# Patient Record
Sex: Female | Born: 1935 | Race: White | Hispanic: No | Marital: Married | Smoking: Never smoker
Health system: Southern US, Community
[De-identification: ages and names within clinical notes are randomized; demographics above are authoritative.]

## PROBLEM LIST (undated history)

## (undated) DIAGNOSIS — I471 Supraventricular tachycardia, unspecified: Secondary | ICD-10-CM

## (undated) DIAGNOSIS — I4719 Other supraventricular tachycardia: Secondary | ICD-10-CM

## (undated) DIAGNOSIS — K219 Gastro-esophageal reflux disease without esophagitis: Secondary | ICD-10-CM

## (undated) DIAGNOSIS — E785 Hyperlipidemia, unspecified: Secondary | ICD-10-CM

## (undated) HISTORY — PX: BREAST SURGERY: SHX581

## (undated) HISTORY — PX: CATARACT EXTRACTION: SUR2

## (undated) HISTORY — DX: Supraventricular tachycardia, unspecified: I47.10

## (undated) HISTORY — PX: ABDOMINAL HYSTERECTOMY: SHX81

## (undated) HISTORY — PX: CHOLECYSTECTOMY: SHX55

## (undated) HISTORY — PX: CARDIOVERSION: SHX1299

## (undated) HISTORY — PX: CARDIAC ELECTROPHYSIOLOGY MAPPING AND ABLATION: SHX1292

## (undated) HISTORY — DX: Hyperlipidemia, unspecified: E78.5

## (undated) HISTORY — DX: Other supraventricular tachycardia: I47.19

## (undated) HISTORY — PX: APPENDECTOMY: SHX54

## (undated) HISTORY — DX: Supraventricular tachycardia: I47.1

## (undated) HISTORY — PX: TONSILLECTOMY: SUR1361

---

## 1997-08-14 ENCOUNTER — Ambulatory Visit (HOSPITAL_COMMUNITY): Admission: RE | Admit: 1997-08-14 | Discharge: 1997-08-14 | Payer: Self-pay | Admitting: Family Medicine

## 1998-08-18 ENCOUNTER — Ambulatory Visit (HOSPITAL_COMMUNITY): Admission: RE | Admit: 1998-08-18 | Discharge: 1998-08-18 | Payer: Self-pay

## 1999-08-20 ENCOUNTER — Encounter: Payer: Self-pay | Admitting: Family Medicine

## 1999-08-20 ENCOUNTER — Ambulatory Visit (HOSPITAL_COMMUNITY): Admission: RE | Admit: 1999-08-20 | Discharge: 1999-08-20 | Payer: Self-pay | Admitting: Family Medicine

## 2000-08-23 ENCOUNTER — Encounter: Payer: Self-pay | Admitting: Family Medicine

## 2000-08-23 ENCOUNTER — Ambulatory Visit (HOSPITAL_COMMUNITY): Admission: RE | Admit: 2000-08-23 | Discharge: 2000-08-23 | Payer: Self-pay | Admitting: Family Medicine

## 2000-09-22 ENCOUNTER — Other Ambulatory Visit: Admission: RE | Admit: 2000-09-22 | Discharge: 2000-09-22 | Payer: Self-pay | Admitting: Family Medicine

## 2001-08-24 ENCOUNTER — Ambulatory Visit (HOSPITAL_COMMUNITY): Admission: RE | Admit: 2001-08-24 | Discharge: 2001-08-24 | Payer: Self-pay | Admitting: Family Medicine

## 2001-08-24 ENCOUNTER — Encounter: Payer: Self-pay | Admitting: Family Medicine

## 2002-07-27 ENCOUNTER — Emergency Department (HOSPITAL_COMMUNITY): Admission: EM | Admit: 2002-07-27 | Discharge: 2002-07-27 | Payer: Self-pay | Admitting: Emergency Medicine

## 2002-08-29 ENCOUNTER — Encounter: Payer: Self-pay | Admitting: Family Medicine

## 2002-08-29 ENCOUNTER — Ambulatory Visit (HOSPITAL_COMMUNITY): Admission: RE | Admit: 2002-08-29 | Discharge: 2002-08-29 | Payer: Self-pay | Admitting: Family Medicine

## 2002-11-08 ENCOUNTER — Other Ambulatory Visit: Admission: RE | Admit: 2002-11-08 | Discharge: 2002-11-08 | Payer: Self-pay | Admitting: Family Medicine

## 2002-11-19 ENCOUNTER — Emergency Department (HOSPITAL_COMMUNITY): Admission: EM | Admit: 2002-11-19 | Discharge: 2002-11-19 | Payer: Self-pay | Admitting: Emergency Medicine

## 2002-11-22 ENCOUNTER — Ambulatory Visit (HOSPITAL_COMMUNITY): Admission: RE | Admit: 2002-11-22 | Discharge: 2002-11-23 | Payer: Self-pay | Admitting: Internal Medicine

## 2003-07-08 ENCOUNTER — Ambulatory Visit (HOSPITAL_COMMUNITY): Admission: RE | Admit: 2003-07-08 | Discharge: 2003-07-08 | Payer: Self-pay | Admitting: Family Medicine

## 2003-09-06 ENCOUNTER — Encounter: Admission: RE | Admit: 2003-09-06 | Discharge: 2003-09-06 | Payer: Self-pay | Admitting: Family Medicine

## 2004-09-17 ENCOUNTER — Encounter: Admission: RE | Admit: 2004-09-17 | Discharge: 2004-09-17 | Payer: Self-pay | Admitting: Family Medicine

## 2004-11-12 ENCOUNTER — Other Ambulatory Visit: Admission: RE | Admit: 2004-11-12 | Discharge: 2004-11-12 | Payer: Self-pay | Admitting: Family Medicine

## 2004-11-17 ENCOUNTER — Ambulatory Visit (HOSPITAL_COMMUNITY): Admission: RE | Admit: 2004-11-17 | Discharge: 2004-11-17 | Payer: Self-pay | Admitting: Family Medicine

## 2004-11-19 ENCOUNTER — Ambulatory Visit (HOSPITAL_COMMUNITY): Admission: RE | Admit: 2004-11-19 | Discharge: 2004-11-19 | Payer: Self-pay | Admitting: Family Medicine

## 2004-11-23 ENCOUNTER — Ambulatory Visit: Admission: RE | Admit: 2004-11-23 | Discharge: 2004-11-23 | Payer: Self-pay | Admitting: Family Medicine

## 2005-08-11 ENCOUNTER — Ambulatory Visit: Payer: Self-pay | Admitting: Cardiology

## 2005-09-20 ENCOUNTER — Encounter: Admission: RE | Admit: 2005-09-20 | Discharge: 2005-09-20 | Payer: Self-pay | Admitting: Family Medicine

## 2005-09-30 ENCOUNTER — Encounter: Admission: RE | Admit: 2005-09-30 | Discharge: 2005-09-30 | Payer: Self-pay | Admitting: Family Medicine

## 2006-09-23 ENCOUNTER — Encounter: Admission: RE | Admit: 2006-09-23 | Discharge: 2006-09-23 | Payer: Self-pay | Admitting: Family Medicine

## 2007-02-07 ENCOUNTER — Ambulatory Visit (HOSPITAL_COMMUNITY): Admission: RE | Admit: 2007-02-07 | Discharge: 2007-02-08 | Payer: Self-pay | Admitting: Urology

## 2007-09-25 ENCOUNTER — Encounter: Admission: RE | Admit: 2007-09-25 | Discharge: 2007-09-25 | Payer: Self-pay | Admitting: Family Medicine

## 2008-03-07 ENCOUNTER — Ambulatory Visit: Payer: Self-pay | Admitting: Cardiology

## 2008-03-07 DIAGNOSIS — R002 Palpitations: Secondary | ICD-10-CM

## 2008-03-07 DIAGNOSIS — I471 Supraventricular tachycardia, unspecified: Secondary | ICD-10-CM | POA: Insufficient documentation

## 2008-03-07 DIAGNOSIS — E785 Hyperlipidemia, unspecified: Secondary | ICD-10-CM

## 2008-04-01 ENCOUNTER — Ambulatory Visit: Payer: Self-pay | Admitting: Cardiology

## 2008-06-03 ENCOUNTER — Ambulatory Visit: Payer: Self-pay | Admitting: Cardiology

## 2008-06-03 ENCOUNTER — Encounter: Payer: Self-pay | Admitting: Cardiology

## 2008-09-25 ENCOUNTER — Encounter: Admission: RE | Admit: 2008-09-25 | Discharge: 2008-09-25 | Payer: Self-pay | Admitting: Family Medicine

## 2009-04-04 ENCOUNTER — Encounter (INDEPENDENT_AMBULATORY_CARE_PROVIDER_SITE_OTHER): Payer: Self-pay | Admitting: *Deleted

## 2009-07-25 ENCOUNTER — Ambulatory Visit: Payer: Self-pay | Admitting: Cardiology

## 2010-01-02 ENCOUNTER — Encounter: Admission: RE | Admit: 2010-01-02 | Discharge: 2010-01-02 | Payer: Self-pay | Admitting: Family Medicine

## 2010-04-19 ENCOUNTER — Encounter: Payer: Self-pay | Admitting: Family Medicine

## 2010-04-30 NOTE — Letter (Signed)
Summary: Appointment - Reminder 2  [facility], Main Office  [address]   [place], [place] [address]   Phone: [phone]  Fax: [phone]     April 04, 2009 MRN: [id number]   [name] [address] [place], [place]  [address]   Dear [name],  Our records indicate that it is time to schedule a follow-up appointment. Dr.Hochrein recommended that you follow up with [place] in March,2011. It is very important that we reach you to schedule this appointment. We look forward to participating in your health care needs. Please contact us at the number listed above at your earliest convenience to schedule your appointment.  If you are unable to make an appointment at this time, give [place] a call so we can update our records.     Sincerely,   Glass blower/designer

## 2010-04-30 NOTE — Assessment & Plan Note (Signed)
Summary: f1y/ gd  Medications Added PROTONIX 40 MG TBEC (PANTOPRAZOLE SODIUM) 1 by mouth daily ASPIRIN 81 MG  TABS (ASPIRIN) 1 by mouth daily METOPROLOL TARTRATE 25 MG TABS (METOPROLOL TARTRATE) 1/2 by mouth daily FLONASE 50 MCG/ACT SUSP (FLUTICASONE PROPIONATE) as directed VITAMIN B-12 1000 MCG TABS (CYANOCOBALAMIN) 1 by mouth daily      Allergies Added:    Visit Type:  Follow-up Primary Provider:  Dr. Merri Brunette  CC:  palpitations.  History of Present Illness: The patient presents for followup of palpitations. Over the past year she has had no significant palpitations until 9 days ago when she had an epidural injection. Since that time she's had lots of problems associated with the injection including fevers and chills aches and pain. This is slowly resolving. During that time she had increased palpitations in fact one day took an extra half of metoprolol. These are sporadic. They're not associated with presyncope or syncope. She's had no syncopal episodes. She's somewhat limited by back pain but denies any chest pressure, neck or arm discomfort. She's had no shortness of breath, PND or orthopnea  Current Medications (verified): 1)  Allegra 180 Mg Tabs (Fexofenadine Hcl) .... One By Mouth Daily 2)  Estrodiol 0.25mg  .... One By Mouth Daily 3)  Crestor 5 Mg Tabs (Rosuvastatin Calcium) .... One By Mouth Daily 4)  Protonix 40 Mg Tbec (Pantoprazole Sodium) .Marland Kitchen.. 1 By Mouth Daily 5)  Timolol Gtts .... As Directed 6)  Xalatan Gtts .... As Directed 7)  Systane Gtts .... As Directed 8)  Optivar Gtts .... As Directed 9)  Aspirin 81 Mg  Tabs (Aspirin) .Marland Kitchen.. 1 By Mouth Daily 10)  Calcium .... One By Mouth Daily 11)  Fish Oil .... One By Mouth Daily 12)  Multivitamin .... One By Mouth Daily 13)  Clorazepate Dipotassium 3.75 Mg Tabs (Clorazepate Dipotassium) .... One By Mouth Daily 14)  Metoprolol Tartrate 25 Mg Tabs (Metoprolol Tartrate) .... 1/2 By Mouth Daily 15)  Flonase 50 Mcg/act Susp  (Fluticasone Propionate) .... As Directed 16)  Vitamin B-12 1000 Mcg Tabs (Cyanocobalamin) .Marland Kitchen.. 1 By Mouth Daily  Allergies (verified): 1)  ! Codeine 2)  ! Flagyl 3)  ! Effexor 4)  ! * Pseudoephedrine 5)  ! Zoloft  Past History:  Past Medical History: HYPERLIPIDEMIA-MIXED (ICD-272.4) SVT/ PSVT/ PAT (ICD-427.0)  Review of Systems       As stated in the HPI and negative for all other systems.   Vital Signs:  Patient profile:   77 year old female Height:      63 inches Weight:      153 pounds BMI:     27.20 Pulse rate:   61 / minute Resp:     16 per minute BP sitting:   122 / 76  (right arm)  Vitals Entered By: Marrion Coy, CNA (July 25, 2009 11:26 AM)  Physical Exam  General:  Well developed, well nourished, in no acute distress. Head:  normocephalic and atraumatic Eyes:  PERRLA/EOM intact; conjunctiva and lids normal. Mouth:  Teeth, gums and palate normal. Oral mucosa normal. Neck:  Neck supple, no JVD. No masses, thyromegaly or abnormal cervical nodes. Chest Wall:  no deformities or breast masses noted Lungs:  Clear bilaterally to auscultation and percussion. Abdomen:  Bowel sounds positive; abdomen soft and non-tender without masses, organomegaly, or hernias noted. No hepatosplenomegaly. Msk:  Back normal, normal gait. Muscle strength and tone normal. Extremities:  No clubbing or cyanosis. Neurologic:  Alert and oriented x 3. Skin:  Intact without lesions or rashes. Cervical Nodes:  no significant adenopathy Axillary Nodes:  no significant adenopathy Inguinal Nodes:  no significant adenopathy Psych:  Normal affect.   Detailed Cardiovascular Exam  Neck    Carotids: Carotids full and equal bilaterally without bruits.      Neck Veins: Normal, no JVD.    Heart    Inspection: no deformities or lifts noted.      Palpation: normal PMI with no thrills palpable.      Auscultation: regular rate and rhythm, S1, S2 without murmurs, rubs, gallops, or clicks.     Vascular    Abdominal Aorta: no palpable masses, pulsations, or audible bruits.      Femoral Pulses: normal femoral pulses bilaterally.      Pedal Pulses: normal pedal pulses bilaterally.      Radial Pulses: normal radial pulses bilaterally.      Peripheral Circulation: no clubbing, cyanosis, or edema noted with normal capillary refill.     EKG  Procedure date:  07/25/2009  Findings:      sinus rhythm, rate 61, axis within normal limits, intervals within normal limits, no acute ST-T wave changes  Impression & Recommendations:  Problem # 1:  PALPITATIONS (ICD-785.1) These are only mildly symptomatic. At this point I would suggest no change in therapy. She can take extra metoprolol p.r.n. Orders: EKG w/ Interpretation (93000)  Her updated medication list for this problem includes:    Aspirin 81 Mg Tabs (Aspirin) .Marland Kitchen... 1 by mouth daily    Metoprolol Tartrate 25 Mg Tabs (Metoprolol tartrate) .Marland Kitchen... 1/2 by mouth daily  Problem # 2:  SVT/ PSVT/ PAT (ICD-427.0) She has had no symptomatic recurrence of this arrhythmia. Orders: EKG w/ Interpretation (93000)  Patient Instructions: 1)  Your physician recommends that you schedule a follow-up appointment AS NEEDED 2)  Your physician recommends that you continue on your current medications as directed. Please refer to the Current Medication list given to you today.

## 2010-07-18 ENCOUNTER — Encounter: Payer: Self-pay | Admitting: Cardiology

## 2010-07-21 ENCOUNTER — Ambulatory Visit (INDEPENDENT_AMBULATORY_CARE_PROVIDER_SITE_OTHER): Payer: Medicare Other | Admitting: Cardiology

## 2010-07-21 ENCOUNTER — Encounter: Payer: Self-pay | Admitting: Cardiology

## 2010-07-21 VITALS — BP 135/59 | HR 61 | Ht 63.5 in | Wt 142.1 lb

## 2010-07-21 DIAGNOSIS — I471 Supraventricular tachycardia: Secondary | ICD-10-CM

## 2010-07-21 DIAGNOSIS — R002 Palpitations: Secondary | ICD-10-CM

## 2010-07-21 DIAGNOSIS — E785 Hyperlipidemia, unspecified: Secondary | ICD-10-CM

## 2010-07-21 NOTE — Patient Instructions (Signed)
You are being scheduled for a stress test to be done with Dr Antoine Poche.  Please follow instructions on the sheet given you. Continue current medications

## 2010-07-21 NOTE — Assessment & Plan Note (Signed)
She had is ablated in 2004. No change in therapy is indicated.

## 2010-07-21 NOTE — Progress Notes (Signed)
HPI The patient presents for followup supraventricular tachycardia and palpitations. Since I last saw her she has had no further cardiovascular symptoms other than occasional palpitations. She starting to do a little exercising along with her husband who recently had bypass. She denies any chest pressure, neck or arm discomfort. She did not have any presyncope or syncope. She is having no new shortness of breath, PND or orthopnea. She has had no weight gain or edema.  Allergies  Allergen Reactions  . Codeine   . Metronidazole   . Pseudoephedrine   . Sertraline Hcl   . Venlafaxine     Current Outpatient Prescriptions  Medication Sig Dispense Refill  . aspirin 81 MG tablet Take 81 mg by mouth daily.        Marland Kitchen azelastine (OPTIVAR) 0.05 % ophthalmic solution 1 drop. As directed       . clorazepate (TRANXENE) 3.75 MG tablet Take 3.75 mg by mouth daily.        . fexofenadine (ALLEGRA) 180 MG tablet Take 180 mg by mouth daily.        . fluticasone (FLONASE) 50 MCG/ACT nasal spray 2 sprays by Nasal route as directed.        . latanoprost (XALATAN) 0.005 % ophthalmic solution 1 drop as directed.        . metoprolol tartrate (LOPRESSOR) 25 MG tablet Take 1/2 tablet daily       . Multiple Vitamin (MULTIVITAMIN) tablet Take 1 tablet by mouth daily.        . Multiple Vitamins-Calcium (VIACTIV MULTI-VITAMIN) CHEW Chew 1 each by mouth 2 (two) times daily.        . Omega-3 Fatty Acids (FISH OIL) 1000 MG CAPS Take 1 capsule by mouth daily.        . pantoprazole (PROTONIX) 40 MG tablet Take 40 mg by mouth daily.        Bertram Gala Glycol-Propyl Glycol (SYSTANE) 0.4-0.3 % SOLN Apply to eye. As directed       . rosuvastatin (CRESTOR) 5 MG tablet Take 5 mg by mouth daily.        . timolol (BETIMOL) 0.5 % ophthalmic solution 1 drop as directed.        Marland Kitchen DISCONTD: calcium carbonate 200 MG capsule Take 250 mg by mouth daily.        Marland Kitchen DISCONTD: rosuvastatin (CRESTOR) 20 MG tablet Take 20 mg by mouth daily.          Marland Kitchen DISCONTD: vitamin B-12 (CYANOCOBALAMIN) 1000 MCG tablet Take 1,000 mcg by mouth daily.          Past Medical History  Diagnosis Date  . Hyperlipidemia     mixed  . SVT (supraventricular tachycardia)   . PSVT (paroxysmal supraventricular tachycardia)   . PAT (paroxysmal atrial tachycardia)     No past surgical history on file.  ROS: As stated in the HPI and negative for all other systems.  PHYSICAL EXAM BP 135/59  Pulse 61  Ht 5' 3.5" (1.613 m)  Wt 142 lb 1.9 oz (64.465 kg)  BMI 24.78 kg/m2 GENERAL:  Well appearing HEENT:  Pupils equal round and reactive, fundi not visualized, oral mucosa unremarkable NECK:  No jugular venous distention, waveform within normal limits, carotid upstroke brisk and symmetric, no bruits, no thyromegaly LYMPHATICS:  No cervical, inguinal adenopathy LUNGS:  Clear to auscultation bilaterally BACK:  No CVA tenderness CHEST:  Unremarkable HEART:  PMI not displaced or sustained,S1 and S2 within normal limits, no S3, no S4,  no clicks, no rubs, no murmurs ABD:  Flat, positive bowel sounds normal in frequency in pitch, no bruits, no rebound, no guarding, no midline pulsatile mass, no hepatomegaly, no splenomegaly EXT:  2 plus pulses throughout, no edema, no cyanosis no clubbing SKIN:  No rashes no nodules NEURO:  Cranial nerves II through XII grossly intact, motor grossly intact throughout PSYCH:  Cognitively intact, oriented to person place and time  EKG:  Sinus rhythm, premature atrial contractions, axis within normal limits, intervals within normal limits, no acute ST-T wave changes.  ASSESSMENT AND PLAN

## 2010-07-21 NOTE — Assessment & Plan Note (Signed)
This is followed closely by her primary provider and I will defer her management.

## 2010-07-21 NOTE — Assessment & Plan Note (Signed)
The patient has rare symptomatic palpitations. At this point no change in therapy is indicated. She would like to start more vigorous exercise regimen with her husband. I will bring her back for a plain old exercise treadmill test to rule out obstructive coronary disease, risk stratify and give her a prescription for exercise.

## 2010-08-11 NOTE — Assessment & Plan Note (Signed)
[facility]                            CARDIOLOGY OFFICE NOTE   NAME:[name], [name]                      MRN:          [id number]  DATE:03/07/2008                            DOB:          [birth year]    PRIMARY CARE PHYSICIAN:  Dario Guardian, M.D.   PRIMARY CARDIOLOGIST:  Rollene Rotunda, MD, [facility]   HISTORY OF PRESENT ILLNESS:  [name] is a pleasant 77 year old  Caucasian female with a known history of supraventricular tachycardia  status post previous ablation by Dr. Lewayne Bunting in 2004.  [name]  has done well since that time.  Currently not on any rhythm  controlling/rate controlling medications at this time.  She saw her  primary care physician recently for episodes of palpitations, not  associated with any lightheadedness, dizziness, presyncope, or syncopal  episodes.  The patient states this is not anything like the SVT, she had  back in 2004.  She states this feels like a flutter or flipping  sensation in her chest that radiates up into her neck, sometimes.  She  notices it more in the evenings when she is relaxing.  It is fleeting,  does not last for any extended period of time and, as stated, no  associated symptoms.  [name], otherwise is in good health.  She  remains very active taking care of her house,up and down 14 steps in her  house daily without any problems.  She saw Dr. Katrinka Blazing as stated recently.  A 12-lead EKG was obtained at Dr. Michaelle Copas office, that shows sinus  rhythm with occasional PVC and a rate of 74.  Blood work was also  obtained at that visit.  H&H of 13.6 and 41.2 respectively.  A  creatinine of 0.8 and potassium 5.3.  TSH of 1.80.  [name] denies  any increased frequency or duration in her palpitations.   REVIEW OF SYSTEMS:  As stated above.   PAST MEDICAL HISTORY:  1. SVT status post EP study/ablation.      a.     Three distinct supraventricular tachycardias, the first       sinus node reentry,  the second AV node reentry, and the third       atrial flutter.  2. Status post laparoscopic cholecystectomy.  3. Status post hysterectomy.  4. Mildly elevated total cholesterol with initiation of statin      therapy.  5. Breast biopsy.  6. Appendectomy.   PHYSICAL EXAMINATION:  VITAL SIGNS:  Weight is 153 pounds, blood  pressure 138/60 with a heart rate of 73.  Repeat blood pressure manually  138/78.  GENERAL:  [name] is in no acute distress.  NECK:  No signs of jugular vein distention at 45-degree angle.  LUNGS:  Clear to auscultation bilaterally.  CARDIOVASCULAR:  Reveals S1 and S2.  Regular rate and rhythm.  ABDOMEN:  Soft, nontender, and positive bowel sounds.  EXTREMITIES:  Lower extremities without clubbing, cyanosis or edema.  Pedal pulses are 2+.  NEUROLOGICAL:  Alert and oriented x3.   A 12-lead EKG showing sinus rhythm with lone PVC at a  rate of 73.  No  acute ST or T-wave changes.   IMPRESSION:  Palpitations:  A 12-lead EKG significant only for lone  premature ventricular contractions.  Recent lab work obtained by primary  care physician showed a normal TSH and potassium within normal range.  [name] is somewhat concerned about her palpitations.  For  reassurance, we will proceed with a 48-hour Holter monitor and have the  patient follow up with Dr. Antoine Poche for reevaluation.  [name] is  not a candidate for any type of beta-blocker at this time.  We will have  her follow up with Dr. Antoine Poche after her Holter monitor for  reevaluation.   CURRENT MEDICATIONS:  Only include:  1. Aspirin 81 mg.  2. Nexium.  3. Allegra.  4. Lorazepam.  5. Estradiol 1 mg.  6. The patient was previously on a beta-blocker, which has been      discontinued.      [name], ACNP  Electronically Signed      Rollene Rotunda, MD, [facility]  Electronically Signed   MB/MedQ  DD: 03/07/2008  DT: 03/08/2008  Job #: [id number]   cc:   Dario Guardian, M.D.

## 2010-08-11 NOTE — Op Note (Signed)
NAMELAKINA, MCINTIRE               ACCOUNT NO.:  [id number]   MEDICAL RECORD NO.:  [id number]          PATIENT TYPE:  AMB   LOCATION:  DAY                          FACILITY:  [facility]   PHYSICIAN:  Martina Sinner, MD DATE OF BIRTH:  1932-10-02   DATE OF PROCEDURE:  02/07/2007  DATE OF DISCHARGE:                               OPERATIVE REPORT   SURGEON:  Martina Sinner, MD.   ASSISTANT:  Dr. Rachel Bo, urology resident.   PREOPERATIVE DIAGNOSIS:  Stress urinary incontinence, mild cystocele,  mild rectocele.   POSTOPERATIVE DIAGNOSIS:  Stress urinary incontinence, mild cystocele,  mild rectocele.   SURGERY:  [facility]) plus cystoscopy plus rectocele  repair.   [name] has stress urinary incontinence.  She has mild prolapse.  Prior to surgery, I was not certain whether or not she had a cystocele  or rectocele that was symptomatic even in the standing position.  I have  dictated in regards to this extensively.   The patient was prepped and draped in the usual fashion.  Extra care was  taken positioning her legs to minimize the risk of compartment syndrome,  neuropathy and DVT.   Initially, two 1 cm incisions were made 1.5 cm lateral to the midline,  one fingerbreadth above the symphysis pubis.  I then made a 2 cm  incision overlying the mid urethra.  I instilled approximately 8 or 9 mL  of a lidocaine epinephrine mixture.  I made a thick anterior vaginal  wall incision underlying the mid urethra, making certain that there was  thick pubocervical fascia.  I dissected sharply to the urethrovesical  angle bilaterally.  I could palpate the urethrovesical angle  bilaterally.   With the bladder emptied, I passed a SPARC needle on top of and along  the back of the symphysis pubis parallel to the midline on the pulp of  my left index finger bilaterally.  I then cystoscoped the patient.  I  felt that the trocars were a little bit too close to the bladder  since  there were indenting the bladder when I would wiggle them.  They were  not submucosal but they were removed and replaced.   Used the same technique, I moved at least a centimeter lateral and  stayed parallel to the midline and delivered the tips onto the pulp of  my left index finger.  I then cystoscoped the patient and the right side  was excellent.  On the left side, there was mild indentation at 2  o'clock but it was minimal.  I really had stayed quite lateral on the  left and I felt it was unsafe to go more lateral with the risk of  bleeding, etc., and it would not be anatomic.  There was efflux of  indigo carmine from both ureteral orifices.  There was no injury to the  bladder or urethra.  Overall I was very pleased with the placement of  the trocars.   With the bladder empty, I attached a SPARC sling and brought it up  through the retropubic space.  I tensioned the  sling over the fat part  of a [employer].  I cut below the blue dots, irrigated the sheath  and removed the sheath.  I was very happy with the position of the  sling.  I was happy with the tension with hypermobility of the sling.  There was very little to no spring-back effect.   All incisions were irrigated.  I closed the anterior vaginal wall with  running 2-0 Vicryl on a CT-1 needle.  I did two interrupted sutures.  I  cut the sling below the skin and closed the skin with 4-0 Vicryl  subcuticular and Dermabond.   There was no question at the beginning of the case and throughout the  case, she had a little to no cystocele.  Her vaginal cuff was very well  supported.  I could not pull the anterior vaginal wall downward even  under anesthesia.  I felt there was no central defect that imbricate and  therefore I did not do a cystocele repair.   Under anesthesia, I double-gloved with my left hand and I felt that she  did have a mild distal rectocele.  She did have a narrow, moderately  long introitus  with no deficiency near the apex posteriorly or apically.   I placed an Allis clamp on the hymenal ring bilaterally and removed a  small triangle of perineal skin.  I then carefully opened up the  posterior vaginal wall in the midline to approximately 2 cm from the  vaginal cuff.  I used approximately 12 mL of a lidocaine epinephrine  mixture to help with hemostasis and with the plane.  She was narrow and  her tissues were a little bit thin and I was careful to use sharp  dissection, followed by a little bit of blunt dissection to take down  the rectum from the posterior vaginal wall to the lateral sidewall  bilaterally.  I did not break through the pararectal pillars  bilaterally.  There was little to no bleeding.   I then gloved again and she did have diffuse weakening of the  rectovaginal fascia but the rectovaginal fascia was reasonable.  She did  have an apical defect.   I did a posterior imbricating repair with 2-0 Vicryl on S8 suture.  I  then used 3-0 Vicryl to do a trapdoor closure of the apical defect with  my usual technique.  I then placed two interrupted 2-0 Vicryls to help  strengthen the one layer closure.  She really did not have a lot of  bulging to imbricate.   I trimmed approximately 2-3 mm of vaginal wall from both sides.  I  closed the posterior vaginal wall with running 2-0 Vicryl and a CT-1  needle.   At the end of the case she had good vaginal length, excellent reduction  of anterior and posterior vaginal wall with no ring or narrowing.  When  I closed the posterior vaginal wall, the suture was brought out through  the perineum and I closed the perineal incision with a subcuticular 2-0  Vicryl suture.   Irrigation was utilized.  Vaginal pack with Estrace cream was inserted.  Catheter was draining blue urine at the end of the case.  Leg position  was good at the end of the case.   Hopefully, Mrs. Cunanan will reach her treatment goal with the rectocele   repair and sling.           ______________________________  Martina Sinner, MD  Electronically  Signed     SAM/MEDQ  D:  02/07/2007  T:  02/07/2007  Job:  [id number]

## 2010-08-11 NOTE — Assessment & Plan Note (Signed)
[facility]                            CARDIOLOGY OFFICE NOTE   NAME:[name], [name]                      MRN:          [id number]  DATE:04/01/2008                            DOB:          16-May-1932    PRIMARY CARE PHYSICIAN:  Carol Guardian, MD   REASON FOR PRESENTATION:  Evaluate the patient with palpations.   HISTORY OF PRESENT ILLNESS:  The patient presents for followup after  seeing our physician extender in early December 2009.  She was having  palpitations as extensively described in that note.  She did wear a  Holter monitor.  This was a 24-hour monitor that demonstrated about  2,500 supraventricular beats out of a total of 111,000.  They were very  rare ventricular beats.  She did have 1 hour, over which she had about  300 beats.  This was at a party around 7 p.m., the night she wore the  monitor.  She did feel this.  She does not keep a diary.  She had no  presyncope or syncope.  She feels strong or skipped beats.  She has not  felt any of the rapid beats which she had prior to SVT ablation.  She is  not having any chest discomfort, neck or arm discomfort.  The patient  does not have any palpitation, presyncope, or syncope.  She is not  having any PND or orthopnea.  She remains active.   PAST MEDICAL HISTORY:  Supraventricular tachycardia, status post  ablation (there were 3 SVT tachycardias with sinus node re-entrant, AV  node re-entrant, and atrial flutter that were ablated), mildly elevated  dyslipidemia, borderline blood pressures in the past, laparoscopic  cholecystectomy, hysterectomy, breast biopsy, appendectomy.   ALLERGIES/INTOLERANCES:  CODEINE, FLAGYL, EFFEXOR, PSEUDOEPHEDRINE, and  ZOLOFT.   MEDICATIONS:  1. Allegra 180 mg daily.  2. Estradiol 0.25 mg daily.  3. Crestor 5 mg daily.  4. Timolol.  5. Xalatan.  6. Cysteine.  7. Optivar.  8. Aspirin 81 mg daily.  9. Calcium 600 mg daily.  10.Fish oil.  11.Multivitamin.  12.Clorazepate 3.75 mg twice daily.   REVIEW OF SYSTEMS:  As stated in the HPI and otherwise negative for  other systems.   PHYSICAL EXAMINATION:  GENERAL:  The patient is pleasant and in no  distress.  VITAL SIGNS:  Blood pressure 130/67, heart rate 70 and regular, weight  154 pounds, and body mass index 26.  HEENT:  Eyes are unremarkable.  Pupils equal, round, and reactive to  light.  Fundi not visualized.  Oral mucosa unremarkable.  NECK:  No jugular venous distention at 45 degrees.  Carotid upstroke  brisk and symmetrical.  No bruits.  No thyromegaly.  LYMPHATICS:  No cervical, axillary, or inguinal adenopathy.  LUNGS:  Clear to auscultation bilaterally.  BACK:  No costovertebral angle tenderness.  CHEST:  Unremarkable.  HEART:  PMI not displaced or sustained.  S1 and S2 within normal limits.  No S3, no S4.  No clicks, no rubs, no murmurs.  ABDOMEN:  Flat, positive bowel sounds, normal in frequency and pitch.  No bruits, no  rebound, no guarding.  No midline pulsatile mass.  No  hepatosplenomegaly or splenomegaly.  SKIN:  No rashes.  No nodules.  EXTREMITIES:  Pulses are 2+ throughout.  No edema, no cyanosis, no  clubbing.  NEUROLOGIC:  Oriented to person, place, and time.  Cranial nerves II  through XII grossly intact.  Motor grossly intact.   EKG:  Sinus rhythm, rate 73, axis within normal limits.  Intervals  within normal limits.  Premature ectopic contraction, no acute ST-T wave  changes.   ASSESSMENT AND PLAN:  1. Palpitation.  We had a long discussion about the nature of      premature atrial contractions which are her predominant rhythm,      disturbance.  At this point, we have settled on a metoprolol pill      and pocket approach to treating this.  She will get 12.5 mg of      immediate release of metoprolol and take this 2-3 times per day on      the days where she is feeling the palpitations.  If she has to take      this frequently then we will  discuss a sustained-release beta-      blocker or calcium-channel blocker and less likely will need      antiarrhythmic therapy.  She does avoid caffeine and does not eat      chocolate.  She has had normal thyroid recently.  Her electrolytes      have been okay.  No further cardiovascular testing is suggested.  2. Dyslipidemia per Dr. Katrinka Adkins with the goal LDL I would suggest is      less than 130 and HDL greater than 50 though this is an aggressive      therapy in a patient without coronary disease.  I will defer to Dr.      Katrinka Adkins.  3. Mildly elevated blood pressure.  This has been borderline in the      past and not recently.  This can be followed.  4. The patient is overweight by her body mass index but not extremely      so.  She should continue with the fitness regimen.  5. Followup.  I would like to see her back in 2 months or sooner if      needed.     Carol Rotunda, MD, [facility]  Electronically Signed   JH/MedQ  DD: 04/01/2008  DT: 04/02/2008  Job #: [id number]   cc:   [name], M.D.

## 2010-08-14 NOTE — Op Note (Signed)
NAME:  [name], [name]                         ACCOUNT NO.:  [id number]   MEDICAL RECORD NO.:  [id number]                   PATIENT TYPE:  OIB   LOCATION:  3731                                 FACILITY:  [facility]   PHYSICIAN:  Doylene Canning. Ladona Ridgel, M.D.               DATE OF BIRTH:  [birth year]   DATE OF PROCEDURE:  11/22/2002  DATE OF DISCHARGE:                                 OPERATIVE REPORT   PREOPERATIVE DIAGNOSIS:  Invasive physiologic study and radiofrequency  catheter ablation of multiple atrial arrhythmias.   INTRODUCTION:  The patient is a 77 year old woman who has had a recurrence  of tachypalpitations and was initially in the emergency room several days  ago with a narrow QRS tachycardia that terminated with adenosine.  Because  she has felt poorly and had near-syncope with her tachypalpitations, she is  now referred for invasive electrophysiologic study and possible catheter  ablation.   DESCRIPTION OF PROCEDURE:  After informed consent was obtained, the patient  was taken to the diagnostic EP lab in the fasted state.  After the usual  preparation and draping, intravenous fentanyl and midazolam were given for  sedation.  A 6 French hexapolar catheter was inserted percutaneously into  the right jugular vein and advanced to the coronary sinus.  A 5 French  quadripolar catheter was inserted percutaneously into the right femoral vein  and advanced to the right ventricle.  A 5 French quadripolar catheter was  inserted percutaneously into the right femoral vein and advanced to the His  bundle region.  After measurement of the basic intervals, rapid ventricular  pacing was carried out from the RV apex at a pacing cycle length of 600 msec  and stepwise decreased down to 340 msec, where VA Wenckebach was observed.  During rapid ventricular pacing the atrial activation sequence was midline  and decremental.  Next programmed ventricular stimulation was carried out  from the RV apex at  basic drive cycle length of 161 msec.  The S1-S2  interval was stepwise decreased from 540 msec down to 260 msec, where the  retrograde AV node ERP was observed.  During programmed ventricular  stimulation the atrial activation sequence was midline and decremental.  Next programmed atrial stimulation was carried out from the coronary cycle  length at a basic drive cycle length of 096 msec.  The S1-S2 interval was  stepwise decreased down to 300 msec, where the AV node ERP was observed.  During programmed atrial stimulation there were multiple AH jumps and echo  beats.  Next rapid atrial pacing was carried out from the coronary sinus at  a pacing cycle length of 500 msec and stepwise decreased down to 390 msec,  where AV Wenckebach was observed.  During rapid atrial pacing the PR  interval remained less than the RR interval.  During rapid atrial pacing  there was initially nonsustained atrial tachycardia.  Isoproterenol between  1and  2 mcg was then given intravenously and additional programmed atrial  stimulation and rapid atrial pacing were carried out.  During rapid atrial  pacing and programmed atrial stimulation on isoproterenol there was an  inducible sustained SVT.  This had a very abnormal atrial activation,  earliest in the His bundle region.  The P-waves appeared to be upright in  the inferior leads.  Pacing was carried out from the RV apex during  tachycardia, and a VA-AV activation sequence was demonstrated.  Interestingly, however, the tachycardia could be terminated with ventricular  pacing.  These findings suggested that the patient had a macro-reentrant  atrial tachycardia not dependent on AV conduction.  The halo catheter was  then maneuvered into the right atrium.  The tachycardia was reinitiated  during isoproterenol and rapid atrial pacing.  This demonstrated the  earliest activation occurring high in the right atrium.  The tachycardia  would start and stop with programmed  atrial stimulation and rapid atrial  pacing.  As noted, these findings strongly suggested a macro-reentrant  atrial tachycardia occurring in the high right atrium consistent with sinus  node reentrant tachycardia.  A total of three RF energy applications were  then delivered to the region near the sinus node.  During the first three RF  energy applications the patient's rate initially increased very dramatically  and then slowed consistent with ablation near the site of the tachycardia.  At this point attempts to reinitiate the tachycardia were unsuccessful.  The  patient still had demonstrable slow pathway conduction in her AV node, and  for this reason the ablation catheter was then maneuvered into the usual  slow pathway region.  Two RF energy applications were then delivered to the  slow pathway region, resulting in accelerated junctional rhythm.  Following  this rapid atrial pacing was again carried out from the coronary sinus.  A  third tachycardia was then initiated.  This was atrial flutter.  With the  halo catheter in place, this demonstrated clockwise atrial flutter with  clockwise activation around the tricuspid annulus.  The ablation catheter  was then re-maneuvered into the usual atrial flutter isthmus.  A total of  three RF energy applications were delivered to the atrial flutter isthmus.  During the first RF energy application atrial flutter was terminated and  sinus rhythm restored.  During the second RF energy application isthmus  block was demonstrated.  A third bonus RF energy application was then  delivered and at this point the catheters were removed, hemostasis assured,  and the patient returned to her room in satisfactory condition.   COMPLICATIONS:  There were no immediate procedural complications.   RESULTS:  A.  BASELINE ELECTROCARDIOGRAM:  The baseline ECG demonstrates normal sinus rhythm with normal axes and intervals.   B.  BASELINE INTERVALS:  The sinus  node cycle length is 846 msec, the HV  interval 55 msec.   C.  RAPID ATRIAL PACING:  Rapid atrial pacing resulted in the initiation of  sinus node reentrant tachycardia as well as atrial flutter during  isoproterenol infusion. During rapid atrial pacing the AV Wenckebach cycle  length was 390 msec.  The PR interval was equal to but not greater than the  RR interval.   D.  PROGRAMMED ATRIAL STIMULATION:  Programmed atrial stimulation also  resulted in the initiation of SVT.  In addition, there was an AH jump and  echo beats noted during programmed atrial stimulation on isoproterenol.  The  AV node ERP was 500/300.  E.  RAPID VENTRICULAR PACING:  Rapid ventricular pacing was carried out from  the RV apex at a pacing cycle length of 600 msec and stepwise decreased down  to 340 msec, where VA Wenckebach was observed.  During rapid ventricular  pacing the atrial activation sequence was midline and decremental.   F.  PROGRAMMED VENTRICULAR STIMULATION:  Programmed ventricular stimulation  was carried out from the RV apex at a basic drive cycle length of 161 msec.  The S1-S2 interval was stepwise decreased from 540 msec down to 260 msec,  where ventricular refractoriness was observed.  During programmed  ventricular stimulation the atrial activation sequence was midline and  decremental.   G.  ARRHYTHMIAS OBSERVED:  1. Sinus node reentrant tachycardia.  Initiation:  Rapid atrial pacing and     programmed atrial stimulation.  Duration:  Sustained.  Termination:     Spontaneous and with catheter ablation and with ventricular pacing.  2. Nonsustained AVNRT.  Initiation:  Programmed atrial stimulation.     Duration:  Nonsustained.  Termination:  Spontaneous.  3. Atrial flutter.  Initiation:  Rapid atrial pacing.  Duration:  Sustained.     Cycle length was 240 msec.  Termination was with catheter ablation.   H.  MAPPING:  Mapping of the patient's right atrium demonstrated the usual  size and  orientation.  Mapping of the patient's atrial flutter demonstrated  clockwise tricuspid annular reentry.   A. RADIOFREQUENCY ENERGY APPLICATION:  A total of eight RF energy     applications were delivered.  During the first three RF energy     applications, which were applied to the sinus node region, there was     successful termination of sinus node reentrant tachycardia.  During the     fourth and fifth RF energy applications there was successful slow pathway     modification and during the last three RF energy applications, there was     successful isthmus ablation.   CONCLUSION:  This study demonstrates successful electrophysiologic study and  radiofrequency catheter ablation of three distinct supraventricular  tachycardias, the first sinus node reentry, the second AV node reentry, and  the third atrial flutter, all performed without immediate procedural complications.                                               Doylene Canning. Ladona Ridgel, M.D.    GWT/MEDQ  D:  11/22/2002  T:  11/23/2002  Job:  [id number]   cc:   Dario Guardian, M.D.  [address]  [place]  [place] [address]  Fax: [phone]   Rollene Rotunda, M.D.   Kathrine Cords, R.N. [facility]

## 2010-08-14 NOTE — Consult Note (Signed)
NAME:  [name], [name]                         ACCOUNT NO.:  [id number]   MEDICAL RECORD NO.:  [id number]                   PATIENT TYPE:  EMS   LOCATION:  MINO                                 FACILITY:  [facility]   PHYSICIAN:  Doylene Canning. Ladona Ridgel, M.D.               DATE OF BIRTH:  26-Jul-1932   DATE OF CONSULTATION:  11/19/2002  DATE OF DISCHARGE:                                   CONSULTATION   ELECTROPHYSIOLOGIC EMERGENCY ROOM CONSULTATION NOTE:   CONSULTING PHYSICIAN:  Doylene Canning. Ladona Ridgel, M.D.   INDICATION FOR CONSULTATION:  Evaluation of SVT.   CHIEF COMPLAINT:  The patient's chief complaint is palpitations.   HISTORY OF PRESENT ILLNESS:  The patient is a very pleasant 76 year old  woman with a history of hyperlipidemia.  She developed her initial episode  of SVT several years ago.  They were initially documented, however, several  months ago.  At that time, she presented to the emergency room with a narrow  QRS tachycardia, which had started after bending over planting flowers.  It  was terminated with adenosine in the emergency room.  The patient has had  two additional sustained episodes of tachy palpitations since then.  She has  had, at other times, episodes of palpitations, which resolve with vagal  maneuvers.  She has had adenosine twice now with termination of her SVT.  She notes that her neck appears full during her SVT, and, afterwards, she  typically has a large amount of urination and feels weak and tired for a day  or two after the episode of tachy palpitations.   PAST MEDICAL HISTORY:  Is notable for hyperlipidemia.   PAST SURGICAL HISTORY:  1. Is notable for hysterectomy.  2. She has a history of breast biopsy.  3. Has a history of lap-chole.  4. She has a history of hysterectomy.   ALLERGIES:  She gives a history of allergy to CODEINE, FLAGYL, EFFEXOR AND  ZOLOFT.   MEDICATIONS:  Include Allegra, Estradiol, p.r.n. Ativan, Toprol XL and other   multi-aspirins.   SOCIAL HISTORY:  The patient is married and retired.  She denies tobacco or  ethanol use.   FAMILY HISTORY:  Noncontributory.   REVIEW OF SYSTEMS:  Notable for no fevers, chills, night sweats, vision or  hearing problems.  She denies skin rashes or lesions.  She denies orthopnea,  PND, cough, or hemoptysis.  She does have chest pain and shortness of breath  with her palpitations.  She denies syncope.  She denies arthralgias and  arthritis.  She denies dysuria, hematuria, or nocturia.  She denies  numbness, depression or anxiety.  She does have some generalized weakness,  particularly after her episodes of SVT.  She denies nausea, vomiting,  diarrhea or constipation.  She denies polyuria or polydipsia.   PHYSICAL EXAMINATION:  GENERAL:  She is a pleasant well-appearing, middle-  aged woman in no distress.  VITAL  SIGNS:  The blood pressure was 122/67, the pulse 85 and regular;  respirations were 18.  HEENT:  Normocephalic and atraumatic.  The pupils are equal and round.  The  oropharynx is moist.  The sclerae were anicteric.  NECK:  The neck revealed no jugular venous distention.  There is no  thyromegaly.  The trachea was midline.  The carotids are 2+ and symmetric.  LUNGS:  The lungs are clear bilaterally to auscultation.  There are no  wheezing, rales or rhonchi.  CARDIOVASCULAR:  Revealed a regular rate and rhythm with a normal S1 and S2.  There are no obvious murmurs appreciated.  ABDOMEN:  Was soft, nontender, nondistended.  There is no organomegaly.  EXTREMITIES:  Demonstrate no cyanosis, clubbing or edema.  The pulses were  2+ and symmetric.  NEUROLOGIC:  She was alert and oriented x 3, with cranial nerves II-XII  grossly intact.  The strength was 5/5 and symmetric.   LABORATORY DATA:  Her initial EKG demonstrates a narrow QRS tachycardia at  170 beats per minute.  There appears to be a short RP tachycardia with a P  wave barrier in the terminal portion of  the QRS complex.  Second ECG,  following adenosine, demonstrates normal sinus rhythm with no evidence of  manifest pre-excitation.   IMPRESSION:  1. Recurrent tachycardic palpitations, secondary to supraventricular     tachycardia.  2. History of hyperlipidemia.   DISCUSSION:  [name] has failed initial medical therapy.  I have  discussed the risks, benefits, goals and expectations of electrophysiologic  study and catheter ablation for curative treatment of her SVT.  She is  considering her options.  She will call us if and when she decides to  proceed with catheter ablation.  We will plan to discharge her from the  emergency room today and let her return home with plans for ablation, as the  patient is interested in this procedure.                                               Doylene Canning. Ladona Ridgel, M.D.    GWT/MEDQ  D:  11/19/2002  T:  11/20/2002  Job:  [id number]   cc:   Dario Guardian, M.D.  [address]  [place]  [place] [address]  Fax: [phone]   Ihor Gully, M.D.   Kathrine Cords, R.N.  [facility]

## 2010-08-14 NOTE — Discharge Summary (Signed)
NAME:  [name], [name]                         ACCOUNT NO.:  [id number]   MEDICAL RECORD NO.:  [id number]                   PATIENT TYPE:  OIB   LOCATION:  3731                                 FACILITY:  [facility]   PHYSICIAN:  Doylene Canning. Ladona Ridgel, M.D.               DATE OF BIRTH:  Mar 22, 1933   DATE OF ADMISSION:  11/22/2002  DATE OF DISCHARGE:  11/23/2002                                 DISCHARGE SUMMARY   PRINCIPAL DIAGNOSIS:  Supraventricular tachycardia.   HISTORY OF PRESENT ILLNESS:  This is a 77 year old female with a history of  hyperlipidemia, who developed her initial episode of supraventricular  tachycardia several years ago.  They were initially documented, however  several months ago at that time she presented to the emergency room with  narrow QRS tachycardia which started after bending over planting flowers.  It was terminated with adenosine in the emergency room.  The patient has had  two additional sustained episodes of tachy palpitations since then, she has  had at other times episodes of palpitations which resolved with vagal  maneuvers.  She has had adenosine twice now with termination of  supraventricular tachycardia, noticed that her neck appears to be full  during her supraventricular tachycardia and afterwards she typically has a  large amount of urination and feels weak and tired for a day or two after  the episode.  The patient was admitted and underwent an supraventricular  tachycardia ablation.  The patient had an EP study which showed sinus node  reentry, successful flow pathway modification, dual AV nodal physiology,  successful sinus node modification and inducible atrial flutter with a  successful atrial flutter isthmus ablation.  The patient tolerated the  procedure well, had no immediate complications and was discharged the  following day in stable condition.  She was discharged on all of her  previous medications with the exception of Toprol.   DISCHARGE  MEDICATIONS:  1. Coated aspirin 81 daily.  2. Calcium 600 2 tablets daily.  3. Esterase 1 daily.  4. Vitamin E 400 units daily.  5. Allegra as needed p.r.n.  6. Tylenol 1-2 tablets every 4-6 hours as needed for pain.   DISCHARGE ACTIVITIES:  1. No heavy lifting or strenuous activity for four days.  2. No driving for two days.   DISCHARGE DIET:  Low fat, low salt, low cholesterol diet.    DISCHARGE INSTRUCTIONS:  She was to call if she developed a lump or any  drainage in her groin.   FOLLOWUP:  She was to follow up with Dr. Doylene Canning. Ladona Ridgel on December 26, 2002 at 11:30 a.m.      Chinita Pester, C.R.N.P. [facility]                 Doylene Canning. Ladona Ridgel, M.D.    DS/MEDQ  D:  11/23/2002  T:  11/23/2002  Job:  [id number]   cc:  Doylene Canning. Ladona Ridgel, M.D.   Dario Guardian, M.D.  [address]  [place]  [place] [address]  Fax: [phone]

## 2010-08-31 ENCOUNTER — Other Ambulatory Visit: Payer: Self-pay | Admitting: Family Medicine

## 2010-08-31 ENCOUNTER — Ambulatory Visit
Admission: RE | Admit: 2010-08-31 | Discharge: 2010-08-31 | Disposition: A | Discharge status: Home / Self Care | Payer: Medicare Other | Source: Ambulatory Visit | Attending: Family Medicine | Admitting: Family Medicine

## 2010-08-31 DIAGNOSIS — R1032 Left lower quadrant pain: Secondary | ICD-10-CM

## 2010-08-31 DIAGNOSIS — R109 Unspecified abdominal pain: Secondary | ICD-10-CM

## 2010-08-31 MED ORDER — IOHEXOL 300 MG/ML  SOLN
100.0000 mL | Freq: Once | INTRAMUSCULAR | Status: AC | PRN
  Administered 2010-08-31: 100 mL via INTRAVENOUS

## 2010-09-10 ENCOUNTER — Ambulatory Visit (INDEPENDENT_AMBULATORY_CARE_PROVIDER_SITE_OTHER): Payer: Medicare Other | Admitting: Cardiology

## 2010-09-10 DIAGNOSIS — R002 Palpitations: Secondary | ICD-10-CM

## 2010-09-10 DIAGNOSIS — R079 Chest pain, unspecified: Secondary | ICD-10-CM

## 2010-09-10 DIAGNOSIS — R000 Tachycardia, unspecified: Secondary | ICD-10-CM

## 2010-09-10 DIAGNOSIS — I471 Supraventricular tachycardia: Secondary | ICD-10-CM

## 2010-09-10 NOTE — Progress Notes (Signed)
Exercise Treadmill Test  Pre-Exercise Testing Evaluation Rhythm: normal sinus  Rate: 62   PR:  .16 QRS:  .09  QT:  .41 QTc: .42     Test  Exercise Tolerance Test Ordering MD: Angelina Sheriff, MD  Interpreting MD:  Angelina Sheriff, MD  Unique Test No: 1  Treadmill:  1  Indication for ETT: Tachycardia  Contraindication to ETT: No   Stress Modality: exercise - treadmill  Cardiac Imaging Performed: non   Protocol: standard Bruce - maximal  Max BP:  158/51  Max MPHR (bpm):  144 85% MPR (bpm):  122  MPHR obtained (bpm): 117 % MPHR obtained:  81  Reached 85% MPHR (min:sec):  NA Total Exercise Time (min-sec):  6:57  Workload in METS:  8.4 Borg Scale: 17  Reason ETT Terminated:  Lightheaded, presyncope    ST Segment Analysis At Rest: normal ST segments - no evidence of significant ST depression With Exercise: no evidence of significant ST depression  Other Information Arrhythmia:  Yes Angina during ETT:  absent (0) Quality of ETT:  non-diagnostic  ETT Interpretation:  borderline (indeterminate) with non-specific ST changes  Comments: The patient had an excellent poor tolerance.  There was no chest pain.  There was an appropriate level of dyspnea.  There were PVCs throughout the study.  She had a normal BP response.  There were no ischemic ST T wave changes.  The test was terminated ealry as she became very presyncopal despite a normal heart rate and BP response.  Recommendations: In adequate ETT.  Unable to rule out CAD.  The patient will come back for a Lexiscan Myoview.  She had dizziness but not increase in PVCs or other arrhythmia to explain this.

## 2010-09-14 ENCOUNTER — Encounter: Payer: Self-pay | Admitting: *Deleted

## 2010-09-16 ENCOUNTER — Ambulatory Visit (HOSPITAL_COMMUNITY): Payer: Medicare Other | Attending: Cardiology | Admitting: Radiology

## 2010-09-16 DIAGNOSIS — R079 Chest pain, unspecified: Secondary | ICD-10-CM | POA: Insufficient documentation

## 2010-09-16 DIAGNOSIS — R0789 Other chest pain: Secondary | ICD-10-CM

## 2010-09-16 DIAGNOSIS — R55 Syncope and collapse: Secondary | ICD-10-CM

## 2010-09-16 DIAGNOSIS — R0989 Other specified symptoms and signs involving the circulatory and respiratory systems: Secondary | ICD-10-CM

## 2010-09-16 MED ORDER — TECHNETIUM TC 99M TETROFOSMIN IV KIT
33.0000 | PACK | Freq: Once | INTRAVENOUS | Status: AC | PRN
  Administered 2010-09-16: 33 via INTRAVENOUS

## 2010-09-16 MED ORDER — REGADENOSON 0.4 MG/5ML IV SOLN
0.4000 mg | Freq: Once | INTRAVENOUS | Status: AC
  Administered 2010-09-16: 0.4 mg via INTRAVENOUS

## 2010-09-16 MED ORDER — TECHNETIUM TC 99M TETROFOSMIN IV KIT
11.0000 | PACK | Freq: Once | INTRAVENOUS | Status: AC | PRN
  Administered 2010-09-16: 11 via INTRAVENOUS

## 2010-09-16 NOTE — Progress Notes (Addendum)
[facility] SITE 3 NUCLEAR MED [address] [place] [address] [phone]  Cardiology Nuclear Med Study  [name] is a 77 y.o. female [id number] 1932/09/24   Nuclear Med Background Indication for Stress Test:  Evaluation for Ischemia and Abnormal EKG History:  '04 Ablation, 09/10/10 [id number] with nonspecific ST changes, H/O SVT/PAT Cardiac Risk Factors: Lipids  Symptoms:  Chest Pain/Pressure with and without Exertion (last episode of chest discomfort was last Saturday), Dizziness, DOE, Near Syncope and Palpitations   Nuclear Pre-Procedure Caffeine/Decaff Intake:  None NPO After: 10:30pm   Lungs:  Clear.  O2 sat 96% on RA. IV 0.9% NS with Angio Cath:  22g  IV Site: R Antecubital  IV Started by:  Bonnita Levan, RN  Chest Size (in):  38 Cup Size: B  Height: 5\' 3"  (1.6 m)  Weight:  143 lb (64.864 kg)  BMI:  Body mass index is 25.33 kg/(m^2). Tech Comments:  Lopressor held this am.    Nuclear Med Study 1 or 2 day study: 1 day  Stress Test Type:  Treadmill/Lexiscan  Reading MD: [name], MD  Order Authorizing Provider:  Rollene Rotunda, MD  Resting Radionuclide: Technetium 37m Tetrofosmin  Resting Radionuclide Dose: 11 mCi   Stress Radionuclide:  Technetium 43m Tetrofosmin  Stress Radionuclide Dose: 33 mCi           Stress Protocol Rest HR: 54 Stress HR: 85  Rest BP: 119/67 Standing Stress BP: 134/52  Exercise Time (min): 2:00 METS: n/a   Predicted Max HR: 144 bpm % Max HR: 59.03 bpm Rate Pressure Product: 29528   Dose of Adenosine (mg):  n/a Dose of Lexiscan: 0.4 mg  Dose of Atropine (mg): n/a Dose of Dobutamine: n/a mcg/kg/min (at max HR)  Stress Test Technologist: Smiley Houseman, CMA-N  Nuclear Technologist:  Domenic Polite, CNMT     Rest Procedure:  Myocardial perfusion imaging was performed at rest 45 minutes following the intravenous administration of Technetium 88m Tetrofosmin.  Rest ECG: Sinus bradycardia, otherwise within  normal limits.  Stress Procedure:  The patient received IV Lexiscan 0.4 mg over 15-seconds with concurrent low level exercise and then Technetium 107m Tetrofosmin was injected at 30-seconds while the patient continued walking one more minute.  There were no significant changes with Lexiscan.  Quantitative spect images were obtained after a 45-minute delay.  Stress ECG: No significant change from baseline ECG  QPS Raw Data Images:  Normal; no motion artifact; normal heart/lung ratio. Stress Images:  Normal homogeneous uptake in all areas of the myocardium. Rest Images:  Normal homogeneous uptake in all areas of the myocardium. Subtraction (SDS):  No evidence of ischemia. Transient Ischemic Dilatation (Normal <1.22):  1.22 Lung/Heart Ratio (Normal <0.45):  .30  Quantitative Gated Spect Images QGS EDV:  59 ml QGS ESV:  16 ml QGS cine images:  Normal Wall Motion QGS EF: 72%  Impression Exercise Capacity:  Lexiscan with low level exercise. BP Response:  Normal blood pressure response. Clinical Symptoms:  abdominal cramps ECG Impression:  No significant ST segment change suggestive of ischemia. Comparison with Prior Nuclear Study: No previous nuclear study performed  Overall Impression:  Normal stress nuclear study.   [name]   Negative study.  No further testing indicated.  Rollene Rotunda

## 2010-09-17 NOTE — Progress Notes (Signed)
Copy Nuc. Med. Report routed to Dr. Antoine Poche 09/08/19 Domenic Polite

## 2010-09-18 ENCOUNTER — Telehealth: Payer: Self-pay | Admitting: Cardiology

## 2010-09-18 NOTE — Telephone Encounter (Signed)
Pt would like results of myoview and would like to know when her next f/u would be

## 2010-09-18 NOTE — Progress Notes (Signed)
Pt aware of results 

## 2010-09-18 NOTE — Telephone Encounter (Signed)
Pt aware of normal test results

## 2010-12-04 ENCOUNTER — Other Ambulatory Visit: Payer: Self-pay | Admitting: Family Medicine

## 2010-12-04 DIAGNOSIS — Z1231 Encounter for screening mammogram for malignant neoplasm of breast: Secondary | ICD-10-CM

## 2011-01-05 ENCOUNTER — Ambulatory Visit
Admission: RE | Admit: 2011-01-05 | Discharge: 2011-01-05 | Disposition: A | Discharge status: Home / Self Care | Payer: Medicare Other | Source: Ambulatory Visit | Attending: Family Medicine | Admitting: Family Medicine

## 2011-01-05 DIAGNOSIS — Z1231 Encounter for screening mammogram for malignant neoplasm of breast: Secondary | ICD-10-CM

## 2011-01-05 LAB — BASIC METABOLIC PANEL
BUN: 14
Creatinine, Ser: 0.72
GFR calc non Af Amer: >60
Glucose, Bld: 113 — ABNORMAL HIGH
Potassium: 3.7

## 2011-01-05 LAB — CBC
Hemoglobin: 13.4
Platelets: 195
RDW: 13.6
WBC: 5

## 2011-01-05 LAB — ABO/RH: ABO/RH(D): A POS

## 2011-01-05 LAB — HEMOGLOBIN AND HEMATOCRIT, BLOOD
HCT: 34.7 — ABNORMAL LOW
Hemoglobin: 12.1

## 2011-01-05 LAB — PROTIME-INR
INR: 0.9
Prothrombin Time: 12.3

## 2011-01-05 LAB — TYPE AND SCREEN
ABO/RH(D): A POS
Antibody Screen: NEGATIVE

## 2011-01-05 LAB — APTT: aPTT: 28

## 2011-07-13 ENCOUNTER — Telehealth: Payer: Self-pay | Admitting: Cardiology

## 2011-07-13 NOTE — Telephone Encounter (Signed)
All Cardiac faxed to Jodie/[facility]  @ [phone] 07/12/11/KM

## 2011-12-24 ENCOUNTER — Other Ambulatory Visit: Payer: Self-pay | Admitting: Family Medicine

## 2011-12-24 DIAGNOSIS — Z1231 Encounter for screening mammogram for malignant neoplasm of breast: Secondary | ICD-10-CM

## 2012-01-10 ENCOUNTER — Ambulatory Visit
Admission: RE | Admit: 2012-01-10 | Discharge: 2012-01-10 | Disposition: A | Discharge status: Home / Self Care | Payer: Medicare Other | Source: Ambulatory Visit | Attending: Family Medicine | Admitting: Family Medicine

## 2012-01-10 DIAGNOSIS — Z1231 Encounter for screening mammogram for malignant neoplasm of breast: Secondary | ICD-10-CM

## 2012-01-31 ENCOUNTER — Other Ambulatory Visit: Payer: Self-pay | Admitting: Family Medicine

## 2012-01-31 ENCOUNTER — Ambulatory Visit
Admission: RE | Admit: 2012-01-31 | Discharge: 2012-01-31 | Disposition: A | Discharge status: Home / Self Care | Payer: Medicare Other | Source: Ambulatory Visit | Attending: Family Medicine | Admitting: Family Medicine

## 2012-01-31 DIAGNOSIS — R109 Unspecified abdominal pain: Secondary | ICD-10-CM

## 2012-02-03 ENCOUNTER — Other Ambulatory Visit: Payer: Self-pay | Admitting: Family Medicine

## 2012-02-03 DIAGNOSIS — R109 Unspecified abdominal pain: Secondary | ICD-10-CM

## 2012-02-04 ENCOUNTER — Ambulatory Visit
Admission: RE | Admit: 2012-02-04 | Discharge: 2012-02-04 | Disposition: A | Discharge status: Home / Self Care | Payer: Medicare Other | Source: Ambulatory Visit | Attending: Family Medicine | Admitting: Family Medicine

## 2012-02-04 DIAGNOSIS — R109 Unspecified abdominal pain: Secondary | ICD-10-CM

## 2012-11-15 ENCOUNTER — Ambulatory Visit (INDEPENDENT_AMBULATORY_CARE_PROVIDER_SITE_OTHER): Payer: 59 | Admitting: Physician Assistant

## 2012-11-15 ENCOUNTER — Encounter: Payer: Self-pay | Admitting: Physician Assistant

## 2012-11-15 VITALS — BP 132/62 | HR 60 | Ht 63.0 in | Wt 145.0 lb

## 2012-11-15 DIAGNOSIS — I471 Supraventricular tachycardia: Secondary | ICD-10-CM

## 2012-11-15 DIAGNOSIS — R002 Palpitations: Secondary | ICD-10-CM

## 2012-11-15 NOTE — Patient Instructions (Addendum)
Your physician has recommended you make the following change in your medication:   ITS OK TO TAKE AN EXTRA METOPROLOL FOR PALPITATIONS  Your physician recommends that you schedule a follow-up appointment in: DR. Antoine Poche AS NEEDED  YOUR PHYSICIAN WOULD LIKE FOR YOUR TO START ON AN WORK OUT PROGRAM.  Your physician recommends that you continue on your current medications as directed. Please refer to the Current Medication list given to you today.

## 2012-11-15 NOTE — Progress Notes (Signed)
HPI:  This is a 79 year old female patient of Dr. Antoine Poche has history of SVT status post radiofrequency ablation. She is followed for occasional symptomatic palpitations. Approximately 3 weeks ago she was under an extreme amount of stress with her granddaughter's wedding and some house renovations. She was waking up every evening with palpitations that last about 3 hours. She says she has extreme amount of stress and anxiety and her palpitations are always worse during this time. She did take extra anxiety medicine as well as an extra half of metoprolol and it did help. She has calmed down since then and is no longer having palpitations.  Allergies -- Codeine   -- Metronidazole   -- Pseudoephedrine   -- Sertraline Hcl   -- Venlafaxine   Current Outpatient Prescriptions on File Prior to Visit: aspirin 81 MG tablet, Take 81 mg by mouth daily.  , Disp: , Rfl:  clorazepate (TRANXENE) 3.75 MG tablet, Take 3.75 mg by mouth daily.  , Disp: , Rfl:  fexofenadine (ALLEGRA) 180 MG tablet, Take 180 mg by mouth daily.  , Disp: , Rfl:  fluticasone (FLONASE) 50 MCG/ACT nasal spray, 2 sprays by Nasal route as directed.  , Disp: , Rfl:  metoprolol tartrate (LOPRESSOR) 25 MG tablet, Take 1/2 tablet daily , Disp: , Rfl:  Multiple Vitamin (MULTIVITAMIN) tablet, Take 1 tablet by mouth daily.  , Disp: , Rfl:  Omega-3 Fatty Acids (FISH OIL) 1000 MG CAPS, Take 1 capsule by mouth daily.  , Disp: , Rfl:  pantoprazole (PROTONIX) 40 MG tablet, Take 40 mg by mouth daily.  , Disp: , Rfl:  rosuvastatin (CRESTOR) 5 MG tablet, Take 5 mg by mouth daily.  , Disp: , Rfl:   No current facility-administered medications on file prior to visit.   Past Medical History:   Hyperlipidemia                                                 Comment:mixed   SVT (supraventricular tachycardia)                           PSVT (paroxysmal supraventricular tachycardia)               PAT (paroxysmal atrial tachycardia)                          No past surgical history on file.  Review of patient's family history indicates:   Coronary artery disease                                   Comment: family hx of   Social History   Marital Status: Married             Spouse Name:                      Years of Education:                 Number of children:             Occupational History Occupation          Psychiatric nurse  retired                                   Social History Main Topics   Smoking Status: Never Smoker                     Smokeless Status: Not on file                      Alcohol Use: No             Drug Use: No             Sexual Activity: Not on file        Other Topics            Concern   None on file  Social History Narrative   None on file    ROS: See history of present illness otherwise negative   PHYSICAL EXAM: Well-nournished, in no acute distress. Neck: No JVD, HJR, Bruit, or thyroid enlargement  Lungs: No tachypnea, clear without wheezing, rales, or rhonchi  Cardiovascular: RRR, PMI not displaced, heart sounds normal, no murmurs, gallops, bruit, thrill, or heave.  Abdomen: BS normal. Soft without organomegaly, masses, lesions or tenderness.  Extremities: without cyanosis, clubbing or edema. Good distal pulses bilateral  SKin: Warm, no lesions or rashes   Musculoskeletal: No deformities  Neuro: no focal signs  BP 132/62  Pulse 60  Ht 5\' 3"  (1.6 m)  Wt 145 lb (65.772 kg)  BMI 25.69 kg/m2  SpO2 97%   EKG: Normal sinus rhythm inferior Q waves, and no acute change

## 2012-11-15 NOTE — Assessment & Plan Note (Signed)
Status post ablation, in normal sinus rhythm

## 2012-11-15 NOTE — Assessment & Plan Note (Signed)
Palpitations worse during recent stress and anxiety. Now they are better. I told the patient she could take extra metoprolol one half to whole tablet during these spells. Followup with Dr. Antoine Poche prn.

## 2012-12-05 ENCOUNTER — Other Ambulatory Visit: Payer: Self-pay

## 2012-12-05 DIAGNOSIS — Z1231 Encounter for screening mammogram for malignant neoplasm of breast: Secondary | ICD-10-CM

## 2013-01-10 ENCOUNTER — Ambulatory Visit: Payer: 59

## 2013-01-16 ENCOUNTER — Ambulatory Visit
Admission: RE | Admit: 2013-01-16 | Discharge: 2013-01-16 | Disposition: A | Discharge status: Home / Self Care | Payer: Medicare Other | Source: Ambulatory Visit

## 2013-01-16 DIAGNOSIS — Z1231 Encounter for screening mammogram for malignant neoplasm of breast: Secondary | ICD-10-CM

## 2013-03-01 ENCOUNTER — Emergency Department (HOSPITAL_COMMUNITY): Payer: Medicare Other

## 2013-03-01 ENCOUNTER — Encounter (HOSPITAL_COMMUNITY): Payer: Self-pay | Admitting: Emergency Medicine

## 2013-03-01 ENCOUNTER — Emergency Department (HOSPITAL_COMMUNITY)
Admission: EM | Admit: 2013-03-01 | Discharge: 2013-03-01 | Disposition: A | Discharge status: Home / Self Care | Payer: Medicare Other | Attending: Emergency Medicine | Admitting: Emergency Medicine

## 2013-03-01 DIAGNOSIS — Z7982 Long term (current) use of aspirin: Secondary | ICD-10-CM | POA: Insufficient documentation

## 2013-03-01 DIAGNOSIS — Z79899 Other long term (current) drug therapy: Secondary | ICD-10-CM | POA: Insufficient documentation

## 2013-03-01 DIAGNOSIS — R079 Chest pain, unspecified: Secondary | ICD-10-CM | POA: Insufficient documentation

## 2013-03-01 DIAGNOSIS — Z8679 Personal history of other diseases of the circulatory system: Secondary | ICD-10-CM | POA: Insufficient documentation

## 2013-03-01 DIAGNOSIS — K219 Gastro-esophageal reflux disease without esophagitis: Secondary | ICD-10-CM | POA: Insufficient documentation

## 2013-03-01 DIAGNOSIS — R51 Headache: Secondary | ICD-10-CM | POA: Insufficient documentation

## 2013-03-01 DIAGNOSIS — E782 Mixed hyperlipidemia: Secondary | ICD-10-CM | POA: Insufficient documentation

## 2013-03-01 DIAGNOSIS — R42 Dizziness and giddiness: Secondary | ICD-10-CM | POA: Insufficient documentation

## 2013-03-01 HISTORY — DX: Gastro-esophageal reflux disease without esophagitis: K21.9

## 2013-03-01 LAB — CBC
HCT: 40.5 % (ref 36.0–46.0)
Hemoglobin: 13.8 g/dL (ref 12.0–15.0)
MCH: 32.8 pg (ref 26.0–34.0)
MCHC: 34.1 g/dL (ref 30.0–36.0)
RBC: 4.21 MIL/uL (ref 3.87–5.11)

## 2013-03-01 LAB — COMPREHENSIVE METABOLIC PANEL
AST: 24 U/L (ref 0–37)
Albumin: 3.9 g/dL (ref 3.5–5.2)
BUN: 17 mg/dL (ref 6–23)
CO2: 30 mEq/L (ref 19–32)
Calcium: 9.6 mg/dL (ref 8.4–10.5)
Creatinine, Ser: 0.7 mg/dL (ref 0.50–1.10)
Glucose, Bld: 90 mg/dL (ref 70–99)
Potassium: 4.6 mEq/L (ref 3.5–5.1)
Sodium: 142 mEq/L (ref 135–145)
Total Bilirubin: 0.4 mg/dL (ref 0.3–1.2)
Total Protein: 7.4 g/dL (ref 6.0–8.3)

## 2013-03-01 LAB — URINALYSIS, ROUTINE W REFLEX MICROSCOPIC
Bilirubin Urine: NEGATIVE
Glucose, UA: NEGATIVE mg/dL
Hgb urine dipstick: NEGATIVE
Ketones, ur: NEGATIVE mg/dL
Protein, ur: NEGATIVE mg/dL
pH: 6.5 (ref 5.0–8.0)

## 2013-03-01 LAB — POCT I-STAT TROPONIN I

## 2013-03-01 LAB — TROPONIN I: Troponin I: <0.3 ng/mL (ref <0.30)

## 2013-03-01 LAB — URINE MICROSCOPIC-ADD ON

## 2013-03-01 MED ORDER — MECLIZINE HCL 25 MG PO TABS: 25.0000 mg | ORAL_TABLET | Freq: Three times a day (TID) | ORAL | Status: DC | PRN

## 2013-03-01 NOTE — ED Notes (Signed)
Patient returned from CT

## 2013-03-01 NOTE — ED Provider Notes (Signed)
CSN: [id number]     Arrival date & time 03/01/13  1304 History   First MD Initiated Contact with Patient 03/01/13 1440     Chief Complaint  Patient presents with  . Chest Pain  . Dizziness  . Headache   (Consider location/radiation/quality/duration/timing/severity/associated sxs/prior Treatment) Patient is a 79 y.o. female presenting with headaches. The history is provided by the patient. No language interpreter was used.  Headache Associated symptoms: dizziness   Associated symptoms: no abdominal pain, no fever, no nausea and no vomiting   Associated symptoms comment:  She states for the past 8 days she has had some mild dizziness in the morning after getting out of bed that resolves for the rest of the day. No pain or nausea. She denies recent illness or fever. Today when she got up she had the worst dizziness she has had and needed help to walk. Still no nausea. She states she went back to bed and began to have a headache on the right side that was sharp and radiated into the right neck. She also had a discomfort on the inside of her left arm that carried over into her chest. Since that time, the arm/chest pain has resolved completely without recurrence and the right sided headache resolved but returned while in the ED, while walking and with dizziness. No visual changes, numbness or tingling.    Past Medical History  Diagnosis Date  . Hyperlipidemia     mixed  . SVT (supraventricular tachycardia)   . PSVT (paroxysmal supraventricular tachycardia)   . PAT (paroxysmal atrial tachycardia)   . GERD (gastroesophageal reflux disease)    Past Surgical History  Procedure Laterality Date  . Breast surgery      biopsy  . Cardioversion    . Cardiac electrophysiology mapping and ablation    . Abdominal hysterectomy    . Appendectomy    . Tonsillectomy    . Cholecystectomy     Family History  Problem Relation Age of Onset  . Coronary artery disease      family hx of   History   Substance Use Topics  . Smoking status: Never Smoker   . Smokeless tobacco: Not on file  . Alcohol Use: No   OB History   Grav Para Term Preterm Abortions TAB SAB Ect Mult Living                 Review of Systems  Constitutional: Negative for fever and chills.  Eyes: Negative for visual disturbance.  Respiratory: Negative.  Negative for shortness of breath.   Cardiovascular: Positive for chest pain. Negative for palpitations and leg swelling.  Gastrointestinal: Negative.  Negative for nausea, vomiting and abdominal pain.  Genitourinary: Negative.  Negative for dysuria.  Musculoskeletal: Negative.   Skin: Negative.   Neurological: Positive for dizziness and headaches.  Psychiatric/Behavioral: Negative for confusion.    Allergies  Codeine; Metronidazole; Pseudoephedrine; Sertraline hcl; and Venlafaxine  Home Medications   Current Outpatient Rx  Name  Route  Sig  Dispense  Refill  . acetaminophen (TYLENOL) 325 MG tablet   Oral   Take 650 mg by mouth every 6 (six) hours as needed for pain.         Marland Kitchen aspirin 81 MG tablet   Oral   Take 81 mg by mouth daily.           . Calcium Carbonate-Vitamin D (CALCIUM 600 + D PO)   Oral   Take by mouth daily.         Marland Kitchen  clorazepate (TRANXENE) 3.75 MG tablet   Oral   Take 3.75 mg by mouth daily.           . dorzolamide-timolol (COSOPT) 22.3-6.8 MG/ML ophthalmic solution      as directed.         Marland Kitchen estradiol (ESTRACE) 0.1 MG/GM vaginal cream   Vaginal   Place 1 Applicatorful vaginally See admin instructions. 1 to 2 times weekly         . fexofenadine (ALLEGRA) 180 MG tablet   Oral   Take 180 mg by mouth daily.           . fluticasone (FLONASE) 50 MCG/ACT nasal spray   Nasal   2 sprays by Nasal route as directed.           . metoprolol tartrate (LOPRESSOR) 25 MG tablet      Take 1/2 tablet daily          . Multiple Vitamin (MULTIVITAMIN) tablet   Oral   Take 1 tablet by mouth daily.           .  naproxen (NAPROSYN) 250 MG tablet   Oral   Take 250 mg by mouth as needed.         . Omega-3 Fatty Acids (FISH OIL) 1000 MG CAPS   Oral   Take 1 capsule by mouth daily.           . pantoprazole (PROTONIX) 40 MG tablet   Oral   Take 40 mg by mouth daily.           . rosuvastatin (CRESTOR) 5 MG tablet   Oral   Take 5 mg by mouth daily.            BP 154/57  Pulse 63  Temp(Src) 98.1 F (36.7 C) (Oral)  Resp 16  Ht 5' 3.75" (1.619 m)  Wt 153 lb 1.6 oz (69.446 kg)  BMI 26.49 kg/m2  SpO2 98% Physical Exam  Constitutional: She is oriented to person, place, and time. She appears well-developed and well-nourished.  HENT:  Head: Normocephalic.  Eyes: Pupils are equal, round, and reactive to light.  Neck: Normal range of motion. Neck supple.  Cardiovascular: Normal rate and regular rhythm.   Pulmonary/Chest: Effort normal and breath sounds normal.  Abdominal: Soft. Bowel sounds are normal. There is no tenderness. There is no rebound and no guarding.  Musculoskeletal: Normal range of motion.  Neurological: She is alert and oriented to person, place, and time. She has normal strength and normal reflexes. No sensory deficit. She displays a negative Romberg sign. Coordination normal.  Cranial nerves 3-12 grossly intact.   Skin: Skin is warm and dry. No rash noted.  Psychiatric: She has a normal mood and affect.    ED Course  Procedures (including critical care time) Labs Review Labs Reviewed  COMPREHENSIVE METABOLIC PANEL - Abnormal; Notable for the following:    GFR calc non Af Amer 81 (*)    All other components within normal limits  CBC  URINALYSIS, ROUTINE W REFLEX MICROSCOPIC  POCT I-STAT TROPONIN I   Results for orders placed during the hospital encounter of 03/01/13  CBC      Result Value Range   WBC 4.8  4.0 - 10.5 K/uL   RBC 4.21  3.87 - 5.11 MIL/uL   Hemoglobin 13.8  12.0 - 15.0 g/dL   HCT 40.9  81.1 - 91.4 %   MCV 96.2  78.0 - 100.0 fL   MCH 32.8  26.0  - 34.0 pg   MCHC 34.1  30.0 - 36.0 g/dL   RDW 16.1  09.6 - 04.5 %   Platelets 183  150 - 400 K/uL  COMPREHENSIVE METABOLIC PANEL      Result Value Range   Sodium 142  135 - 145 mEq/L   Potassium 4.6  3.5 - 5.1 mEq/L   Chloride 104  96 - 112 mEq/L   CO2 30  19 - 32 mEq/L   Glucose, Bld 90  70 - 99 mg/dL   BUN 17  6 - 23 mg/dL   Creatinine, Ser 4.09  0.50 - 1.10 mg/dL   Calcium 9.6  8.4 - 81.1 mg/dL   Total Protein 7.4  6.0 - 8.3 g/dL   Albumin 3.9  3.5 - 5.2 g/dL   AST 24  0 - 37 U/L   ALT 19  0 - 35 U/L   Alkaline Phosphatase 58  39 - 117 U/L   Total Bilirubin 0.4  0.3 - 1.2 mg/dL   GFR calc non Af Amer 81 (*) >90 mL/min   GFR calc Af Amer >90  >90 mL/min  POCT I-STAT TROPONIN I      Result Value Range   Troponin i, poc 0.00  0.00 - 0.08 ng/mL   Comment 3             Imaging Review Dg Chest 2 View  03/01/2013   CLINICAL DATA:  Chest pain and dizziness.  EXAM: CHEST  2 VIEW  COMPARISON:  01/31/2012  FINDINGS: The heart size and mediastinal contours are within normal limits. Both lungs are clear. The visualized skeletal structures are unremarkable.  IMPRESSION: No active cardiopulmonary disease.   Electronically Signed   By: Marlan Palau M.D.   On: 03/01/2013 14:11    EKG Interpretation   None       MDM  No diagnosis found. 1. Dizziness     Arnoldo Hooker, PA-C 03/01/13 1627

## 2013-03-01 NOTE — ED Notes (Signed)
IV team unable to start line. Informed EDP. Verbal orders given to do MRI/MRA w/o contrast. Notified MRI. They will come and get the patient shortly. Updated patient/family on plan. No other needs assessed at this time. Will continue to monitor.

## 2013-03-01 NOTE — ED Notes (Signed)
Patient transported to MRI 

## 2013-03-01 NOTE — ED Provider Notes (Signed)
MRI neg for acute pathology.  Pt denies dizziness now.  UA not suggestive of UTI.  Will send culture.  Will have pt f/u with PcP and cardiology for ongoing problems.  Gwyneth Sprout, MD 03/01/13 [phone]

## 2013-03-01 NOTE — ED Notes (Signed)
IV team at bedside 

## 2013-03-01 NOTE — ED Notes (Signed)
Unable to complete MRI due to inadequate IV access. IV team will be notified to come and try again. Once IV access is obtained, patient will have MRI

## 2013-03-01 NOTE — ED Notes (Signed)
IV team coming to attempt re-start.

## 2013-03-01 NOTE — ED Notes (Addendum)
Pt c/o intermittent  L sided chest pain x 3 weeks.  Last night the pain increased and pain runs from L arm to L chest.  Pt awoke today feeling dizzy.  Dizziness is not positional.  Hx of svt and cardioversion.  Pt now c/o R sided headache.

## 2013-03-01 NOTE — ED Provider Notes (Signed)
Medical screening examination/treatment/procedure(s) were conducted as a shared visit with non-physician practitioner(s) and myself.  I personally evaluated the patient during the encounter.  1 week history of vertigo and dizziness in the morning that resolves as day progresses. No focal weakness, numbness, tingling. No visual changes. L chest wall tender. CN 2-12 intact, no ataxia on finger to nose, no nystagmus, 5/5 strength throughout, no pronator drift, Romberg negative, normal gait. Test of skew negative, romberg negative. Head impulse testing negative. PA upstill d/w neurology dr. Cyril Mourning who recommended MRI/MRA and discharge if negative. Does not appear consistent with TIA.   EKG Interpretation   None        Glynn Octave, MD 03/01/13 [phone]

## 2013-03-15 ENCOUNTER — Encounter (HOSPITAL_COMMUNITY): Payer: Self-pay | Admitting: Emergency Medicine

## 2013-03-15 ENCOUNTER — Emergency Department (HOSPITAL_COMMUNITY): Payer: Medicare Other

## 2013-03-15 ENCOUNTER — Emergency Department (HOSPITAL_COMMUNITY)
Admission: EM | Admit: 2013-03-15 | Discharge: 2013-03-15 | Disposition: A | Discharge status: Home / Self Care | Payer: Medicare Other | Attending: Emergency Medicine | Admitting: Emergency Medicine

## 2013-03-15 DIAGNOSIS — Z7982 Long term (current) use of aspirin: Secondary | ICD-10-CM | POA: Insufficient documentation

## 2013-03-15 DIAGNOSIS — I471 Supraventricular tachycardia, unspecified: Secondary | ICD-10-CM | POA: Insufficient documentation

## 2013-03-15 DIAGNOSIS — Z79899 Other long term (current) drug therapy: Secondary | ICD-10-CM | POA: Insufficient documentation

## 2013-03-15 DIAGNOSIS — Z9889 Other specified postprocedural states: Secondary | ICD-10-CM | POA: Insufficient documentation

## 2013-03-15 DIAGNOSIS — R002 Palpitations: Secondary | ICD-10-CM | POA: Insufficient documentation

## 2013-03-15 DIAGNOSIS — I498 Other specified cardiac arrhythmias: Secondary | ICD-10-CM | POA: Insufficient documentation

## 2013-03-15 DIAGNOSIS — E782 Mixed hyperlipidemia: Secondary | ICD-10-CM | POA: Insufficient documentation

## 2013-03-15 DIAGNOSIS — K219 Gastro-esophageal reflux disease without esophagitis: Secondary | ICD-10-CM | POA: Insufficient documentation

## 2013-03-15 DIAGNOSIS — R079 Chest pain, unspecified: Secondary | ICD-10-CM | POA: Insufficient documentation

## 2013-03-15 DIAGNOSIS — IMO0002 Reserved for concepts with insufficient information to code with codable children: Secondary | ICD-10-CM | POA: Insufficient documentation

## 2013-03-15 LAB — BASIC METABOLIC PANEL
BUN: 20 mg/dL (ref 6–23)
Chloride: 104 mEq/L (ref 96–112)
GFR calc Af Amer: >90 mL/min (ref >90)
GFR calc non Af Amer: 82 mL/min — ABNORMAL LOW (ref >90)
Glucose, Bld: 116 mg/dL — ABNORMAL HIGH (ref 70–99)
Potassium: 3.9 mEq/L (ref 3.5–5.1)

## 2013-03-15 LAB — CBC
HCT: 39.1 % (ref 36.0–46.0)
Hemoglobin: 13.2 g/dL (ref 12.0–15.0)
RDW: 13.2 % (ref 11.5–15.5)
WBC: 5.3 10*3/uL (ref 4.0–10.5)

## 2013-03-15 LAB — TROPONIN I: Troponin I: <0.3 ng/mL (ref <0.30)

## 2013-03-15 NOTE — Consult Note (Signed)
Primary Physician: Primary Cardiologist:  Hochrein   HPI: Marcelo Baldy is a 79 yo who we are asked to see re heart pounding and chest discomfort.  The patient has a history of SVT (s/p Ablation in 2004)  Has had palpitations since  Wore event monitors in past She was last seen in cardiology by Leda Gauze when she had a spell of palptations this August.  The patient also had a normal myoview in 2012.  The patient had a good day yesterday  Went to bed around 10:30  Woke up at 12:30 with her heart pounding  Isoloated skips  Wouldn't stop   Took an extra metoprolol and ASA    Eased but wouldn't go away   Around that time had a prickly sensation in precordium  Also had a 15 min episode of LUQ pain radiating to epigastruim  Has a hx of indigestion. Eased off. Daughter brought her to ER Now symptom free.      Past Medical History  Diagnosis Date  . Hyperlipidemia     mixed  . SVT (supraventricular tachycardia)   . PSVT (paroxysmal supraventricular tachycardia)   . PAT (paroxysmal atrial tachycardia)   . GERD (gastroesophageal reflux disease)        Allergies  Allergen Reactions  . Codeine Nausea Only  . Erythromycin Nausea Only  . Metronidazole Nausea Only  . Pseudoephedrine Other (See Comments)    Skin crawling  . Sertraline Hcl Other (See Comments)    Increased depression  . Venlafaxine Other (See Comments)    Increased depression    History   Social History  . Marital Status: Married    Spouse Name: N/A    Number of Children: N/A  . Years of Education: N/A   Occupational History  . retired    Social History Main Topics  . Smoking status: Never Smoker   . Smokeless tobacco: Not on file  . Alcohol Use: No  . Drug Use: No  . Sexual Activity: Not on file   Other Topics Concern  . Not on file   Social History Narrative  . No narrative on file    Family History  Problem Relation Age of Onset  . Coronary artery disease      family hx of    REVIEW OF  SYSTEMS:  All systems reviewed  Negative to the above problem except as noted above.    PHYSICAL EXAM: Filed Vitals:   03/15/13 0700  BP: 142/51  Pulse: 59  Temp:   Resp: 12    No intake or output data in the 24 hours ending 03/15/13 0735  General:  Well appearing. No respiratory difficulty HEENT: normal Neck: supple. no JVD. Carotids 2+ bilat; no bruits. No lymphadenopathy or thryomegaly appreciated. Cor: PMI nondisplaced. Regular rate & rhythm. No rubs, gallops or murmurs. Lungs: clear Abdomen: soft, nontender, nondistended. No hepatosplenomegaly. No bruits or masses. Good bowel sounds. Extremities: no cyanosis, clubbing, rash, edema Neuro: alert & oriented x 3, cranial nerves grossly intact. moves all 4 extremities w/o difficulty. Affect pleasant.  ECG:  SR 69 with PAC  Results for orders placed during the hospital encounter of 03/15/13 (from the past 24 hour(s))  CBC     Status: None   Collection Time    03/15/13  2:50 AM      Result Value Range   WBC 5.3  4.0 - 10.5 K/uL   RBC 4.10  3.87 - 5.11 MIL/uL   Hemoglobin 13.2  12.0 -  15.0 g/dL   HCT 16.1  09.6 - 04.5 %   MCV 95.4  78.0 - 100.0 fL   MCH 32.2  26.0 - 34.0 pg   MCHC 33.8  30.0 - 36.0 g/dL   RDW 40.9  81.1 - 91.4 %   Platelets 170  150 - 400 K/uL  BASIC METABOLIC PANEL     Status: Abnormal   Collection Time    03/15/13  2:50 AM      Result Value Range   Sodium 140  135 - 145 mEq/L   Potassium 3.9  3.5 - 5.1 mEq/L   Chloride 104  96 - 112 mEq/L   CO2 26  19 - 32 mEq/L   Glucose, Bld 116 (*) 70 - 99 mg/dL   BUN 20  6 - 23 mg/dL   Creatinine, Ser 7.82  0.50 - 1.10 mg/dL   Calcium 9.1  8.4 - 95.6 mg/dL   GFR calc non Af Amer 82 (*) >90 mL/min   GFR calc Af Amer >90  >90 mL/min  POCT I-STAT TROPONIN I     Status: None   Collection Time    03/15/13  2:59 AM      Result Value Range   Troponin i, poc 0.00  0.00 - 0.08 ng/mL   Comment 3           TROPONIN I     Status: None   Collection Time    03/15/13   6:18 AM      Result Value Range   Troponin I <0.30  <0.30 ng/mL   Dg Chest Port 1 View  03/15/2013   CLINICAL DATA:  Chest pain  EXAM: PORTABLE CHEST - 1 VIEW  COMPARISON:  03/01/2013  FINDINGS: The heart size and mediastinal contours are within normal limits. Both lungs are clear. The visualized skeletal structures are unremarkable.  IMPRESSION: No active disease.   Electronically Signed   By: Tiburcio Pea M.D.   On: 03/15/2013 03:31     ASSESSMENT:  Patient a 79 yo with hsitory of SVT (s/p ablation)  Now wakes up with heart pounding  Different from SVT.  Accomp by "prickly" sensation in chest. Now resolved  EKG neg  Labs unrmarkable  I do not think above was SVT  ? Isolated PAC/PVC ? afib (does not sound like it)    Chest symptoms and abdominal symptoms are atypical  I would set up for event monitor with outpatient f/u   Patient does note that she stays tense all the time  Upper body muscles tense.  Question if this is involved in above symptoms  I recomm she see a physical therapist (A Adolph Pollack)  2  LUQ pain  Atypical  ? GI  Note she reports UA is pending.    Will arrange for outpatinet monitor and f/u.     PLAN/DISCUSSION:

## 2013-03-15 NOTE — ED Notes (Signed)
Cardiologist, Dr. Tenny Craw at bedside.

## 2013-03-15 NOTE — ED Notes (Signed)
metropolol 25 mg, ASA 325mg  was taken at 0120

## 2013-03-15 NOTE — ED Notes (Signed)
Pt c/o cp that started out over top of her left breast and is now more around her left neck, and under her left breast

## 2013-03-15 NOTE — ED Notes (Signed)
Report received, assumed care.  

## 2013-03-15 NOTE — ED Notes (Signed)
C/o L sided chest pain that woke her up around 12:30am.  States pain feels like pins and needles.  Denies sob, nausea, and vomiting.

## 2013-03-15 NOTE — ED Provider Notes (Signed)
CSN: [id number]     Arrival date & time 03/15/13  0214 History   First MD Initiated Contact with Patient 03/15/13 0407     Chief Complaint  Patient presents with  . Chest Pain   (Consider location/radiation/quality/duration/timing/severity/associated sxs/prior Treatment) HPI Comments: [name] is a 79 yo with hx of HTN, PSVT, neg nuclear stress in 2012 comes in with cc of of palpitations and chest pain.  who we are asked to see re heart pounding and chest discomfort. Pt states that she woke up at 12:30 with her heart pounding, isoloated skips that wouldn't stop. She checked her vitals - and her HR was in the 60s, and BP was elevated at that time. She didn't feel dizzy, nor did she feel like she was going to pass out. Soon after she had a prickly sensation in precordium  Also had about 30 min episode of Left flank pain radiating to epigastruim  Has a hx of indigestion, but her pain was more severe. The pain spontaneously ceased, and she had one more episode while in the ED. No pressure like component, no assoicated dib, nausea. Pt has no hx of renal stones, she denies any dysuria, hematuria, fevers, chills.   Patient is a 79 y.o. female presenting with chest pain. The history is provided by the patient and medical records.  Chest Pain Associated symptoms: palpitations   Associated symptoms: no abdominal pain, no cough, no nausea, no shortness of breath and not vomiting     Past Medical History  Diagnosis Date  . Hyperlipidemia     mixed  . SVT (supraventricular tachycardia)   . PSVT (paroxysmal supraventricular tachycardia)   . PAT (paroxysmal atrial tachycardia)   . GERD (gastroesophageal reflux disease)    Past Surgical History  Procedure Laterality Date  . Breast surgery      biopsy  . Cardioversion    . Cardiac electrophysiology mapping and ablation    . Abdominal hysterectomy    . Appendectomy    . Tonsillectomy    . Cholecystectomy     Family History  Problem Relation Age  of Onset  . Coronary artery disease      family hx of   History  Substance Use Topics  . Smoking status: Never Smoker   . Smokeless tobacco: Not on file  . Alcohol Use: No   OB History   Grav Para Term Preterm Abortions TAB SAB Ect Mult Living                 Review of Systems  Constitutional: Negative for activity change.  HENT: Negative for facial swelling.   Respiratory: Negative for cough, shortness of breath and wheezing.   Cardiovascular: Positive for chest pain and palpitations.  Gastrointestinal: Negative for nausea, vomiting, abdominal pain, diarrhea, constipation, blood in stool and abdominal distention.  Genitourinary: Negative for hematuria and difficulty urinating.  Musculoskeletal: Negative for neck pain.  Skin: Negative for color change.  Neurological: Negative for speech difficulty.  Hematological: Does not bruise/bleed easily.  Psychiatric/Behavioral: Negative for confusion.    Allergies  Codeine; Erythromycin; Metronidazole; Pseudoephedrine; Sertraline hcl; and Venlafaxine  Home Medications   Current Outpatient Rx  Name  Route  Sig  Dispense  Refill  . aspirin 325 MG tablet   Oral   Take 325 mg by mouth once.         Marland Kitchen aspirin 81 MG tablet   Oral   Take 81 mg by mouth daily.           Marland Kitchen  Calcium Carbonate-Vitamin D (CALCIUM 600 + D PO)   Oral   Take by mouth daily.         . clorazepate (TRANXENE) 3.75 MG tablet   Oral   Take 3.75 mg by mouth daily.           . dorzolamide-timolol (COSOPT) 22.3-6.8 MG/ML ophthalmic solution   Both Eyes   Place 1 drop into both eyes 2 (two) times daily.         . fexofenadine (ALLEGRA) 180 MG tablet   Oral   Take 180 mg by mouth daily as needed for allergies or rhinitis.         . fluticasone (FLONASE) 50 MCG/ACT nasal spray   Nasal   2 sprays by Nasal route as directed.           . meclizine (ANTIVERT) 25 MG tablet   Oral   Take 1 tablet (25 mg total) by mouth 3 (three) times daily as  needed for dizziness.   15 tablet   0   . metoprolol tartrate (LOPRESSOR) 25 MG tablet   Oral   Take 25 mg by mouth daily.         . Multiple Vitamin (MULTIVITAMIN) tablet   Oral   Take 1 tablet by mouth daily.          . naproxen sodium (ANAPROX) 220 MG tablet   Oral   Take 220 mg by mouth 2 (two) times daily as needed (for pain).         . Omega-3 Fatty Acids (FISH OIL) 1000 MG CAPS   Oral   Take 1 capsule by mouth daily.           . pantoprazole (PROTONIX) 40 MG tablet   Oral   Take 40 mg by mouth daily.           . rosuvastatin (CRESTOR) 5 MG tablet   Oral   Take 5 mg by mouth daily.            BP 122/84  Pulse 59  Temp(Src) 98.3 F (36.8 C) (Oral)  Resp 18  SpO2 98% Physical Exam  Nursing note and vitals reviewed. Constitutional: She is oriented to person, place, and time. She appears well-developed.  HENT:  Head: Normocephalic and atraumatic.  Eyes: Conjunctivae and EOM are normal. Pupils are equal, round, and reactive to light.  Neck: Normal range of motion. Neck supple. No JVD present.  Cardiovascular: Normal rate, regular rhythm and normal heart sounds.   Pulmonary/Chest: Effort normal and breath sounds normal. No respiratory distress.  Abdominal: Soft. Bowel sounds are normal. She exhibits no distension. There is no tenderness. There is no rebound and no guarding.  Neurological: She is alert and oriented to person, place, and time.  Skin: Skin is warm and dry.    ED Course  Procedures (including critical care time) Labs Review Labs Reviewed  BASIC METABOLIC PANEL - Abnormal; Notable for the following:    Glucose, Bld 116 (*)    GFR calc non Af Amer 82 (*)    All other components within normal limits  CBC  TROPONIN I  POCT I-STAT TROPONIN I   Imaging Review Dg Chest Port 1 View  03/15/2013   CLINICAL DATA:  Chest pain  EXAM: PORTABLE CHEST - 1 VIEW  COMPARISON:  03/01/2013  FINDINGS: The heart size and mediastinal contours are  within normal limits. Both lungs are clear. The visualized skeletal structures are unremarkable.  IMPRESSION: No active  disease.   Electronically Signed   By: Tiburcio Pea M.D.   On: 03/15/2013 03:31    EKG Interpretation    Date/Time:  Thursday March 15 2013 02:16:51 EST Ventricular Rate:  69 PR Interval:  162 QRS Duration: 86 QT Interval:  406 QTC Calculation: 435 R Axis:   69 Text Interpretation:  Sinus rhythm with Premature supraventricular complexes Otherwise normal ECG No acute findings Confirmed by Rhunette Croft, MD, Chandel Zaun ([id number]) on 03/15/2013 10:22:39 PM            MDM   1. Chest pain   2. Palpitations    Pt comes in with cc of palpitations and chest pain. Pt has hx of PSVT - and her palpitations seem stemming from them. She had no syncope, near syncope, dib associated with it. It appears that even the chest pains were not directly related to the palpitations. She is in the 60s during my ecval. She had tajken extra metop prior to ED arrival.  Chest pain is very atypical. Pt is 80, with hx of HTN, and a neg STRESS in 2012. I called [facility], to see if we can get her scheduled for possible stress as an outpatient, and Dr. Tenny Craw felt decided to come and see the patient in the ED.  Eventually, pt was discharged after [facility] clearance. Trops x 2 neg. No chest pain since my assessment.   Derwood Kaplan, MD 03/15/13 2226

## 2013-03-16 ENCOUNTER — Telehealth: Payer: Self-pay | Admitting: Cardiology

## 2013-03-16 DIAGNOSIS — R002 Palpitations: Secondary | ICD-10-CM

## 2013-03-16 NOTE — Telephone Encounter (Signed)
Per Dr Tenny Craw' consult note - pt to have an event monitor for palps - will have pt called to schedule.

## 2013-03-16 NOTE — Telephone Encounter (Signed)
New Problem:  Pt states she was recently in the hospital and was told to call our office for a heart monitor. Pt does not have an order for a heart monitor. Pt would like to speak with the nurse to find out when/if she needs to come in for a heart monitor. Pt is requesting a call back.

## 2013-03-16 NOTE — Telephone Encounter (Signed)
Husband aware that pt will be called to schedule the appointment.

## 2013-03-21 ENCOUNTER — Encounter: Payer: Self-pay | Admitting: *Deleted

## 2013-03-21 ENCOUNTER — Encounter (INDEPENDENT_AMBULATORY_CARE_PROVIDER_SITE_OTHER): Payer: Medicare Other

## 2013-03-21 DIAGNOSIS — R002 Palpitations: Secondary | ICD-10-CM

## 2013-03-21 NOTE — Progress Notes (Signed)
Patient ID: [name], female   DOB: 1933-02-05, 79 y.o.   MRN: [id number] E-Cardio verite 30 day cardiac event monitor applied to patient.

## 2013-04-10 IMAGING — CR DG CHEST 2V
2 series · 2 of 2 positions shown · non-contrast
Comparison: 08/31/2010.

CLINICAL DATA: 79-year-old female with pain, chest tightness.

CHEST - 2 VIEW

[view not recorded (1 of 2)]
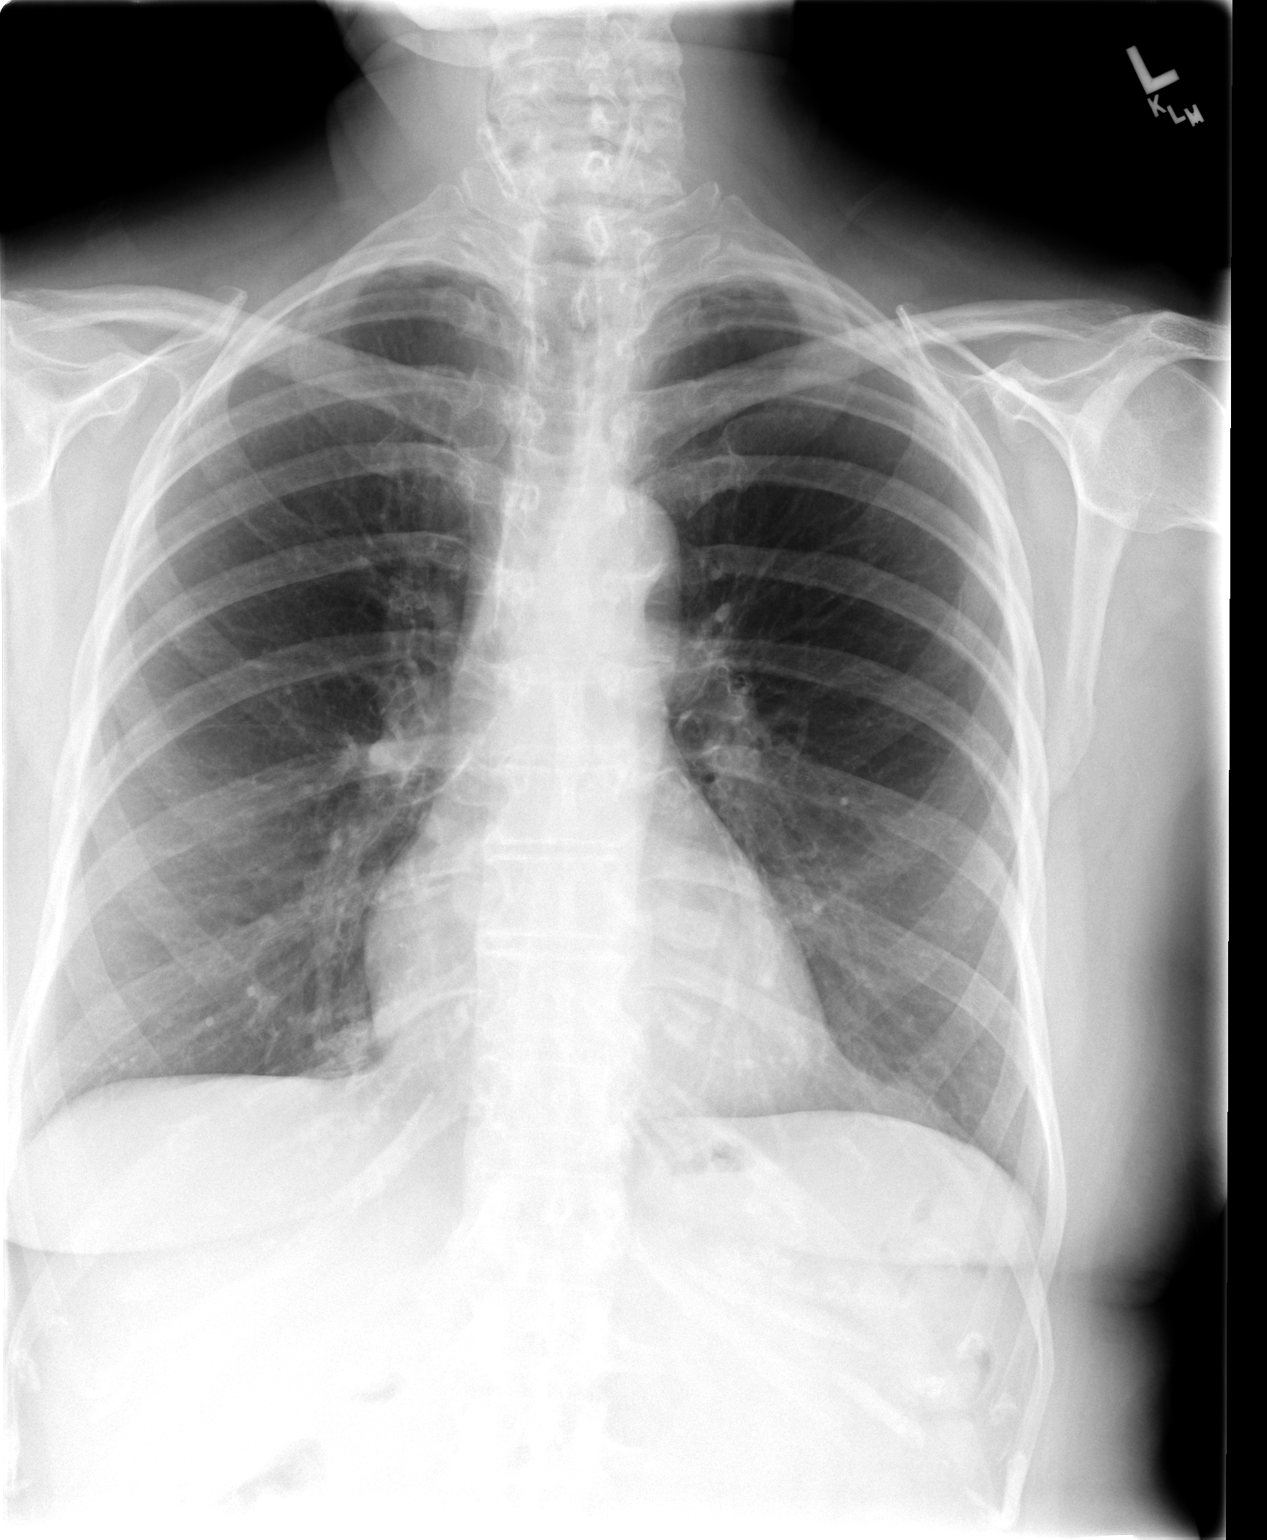

[view not recorded (2 of 2)]
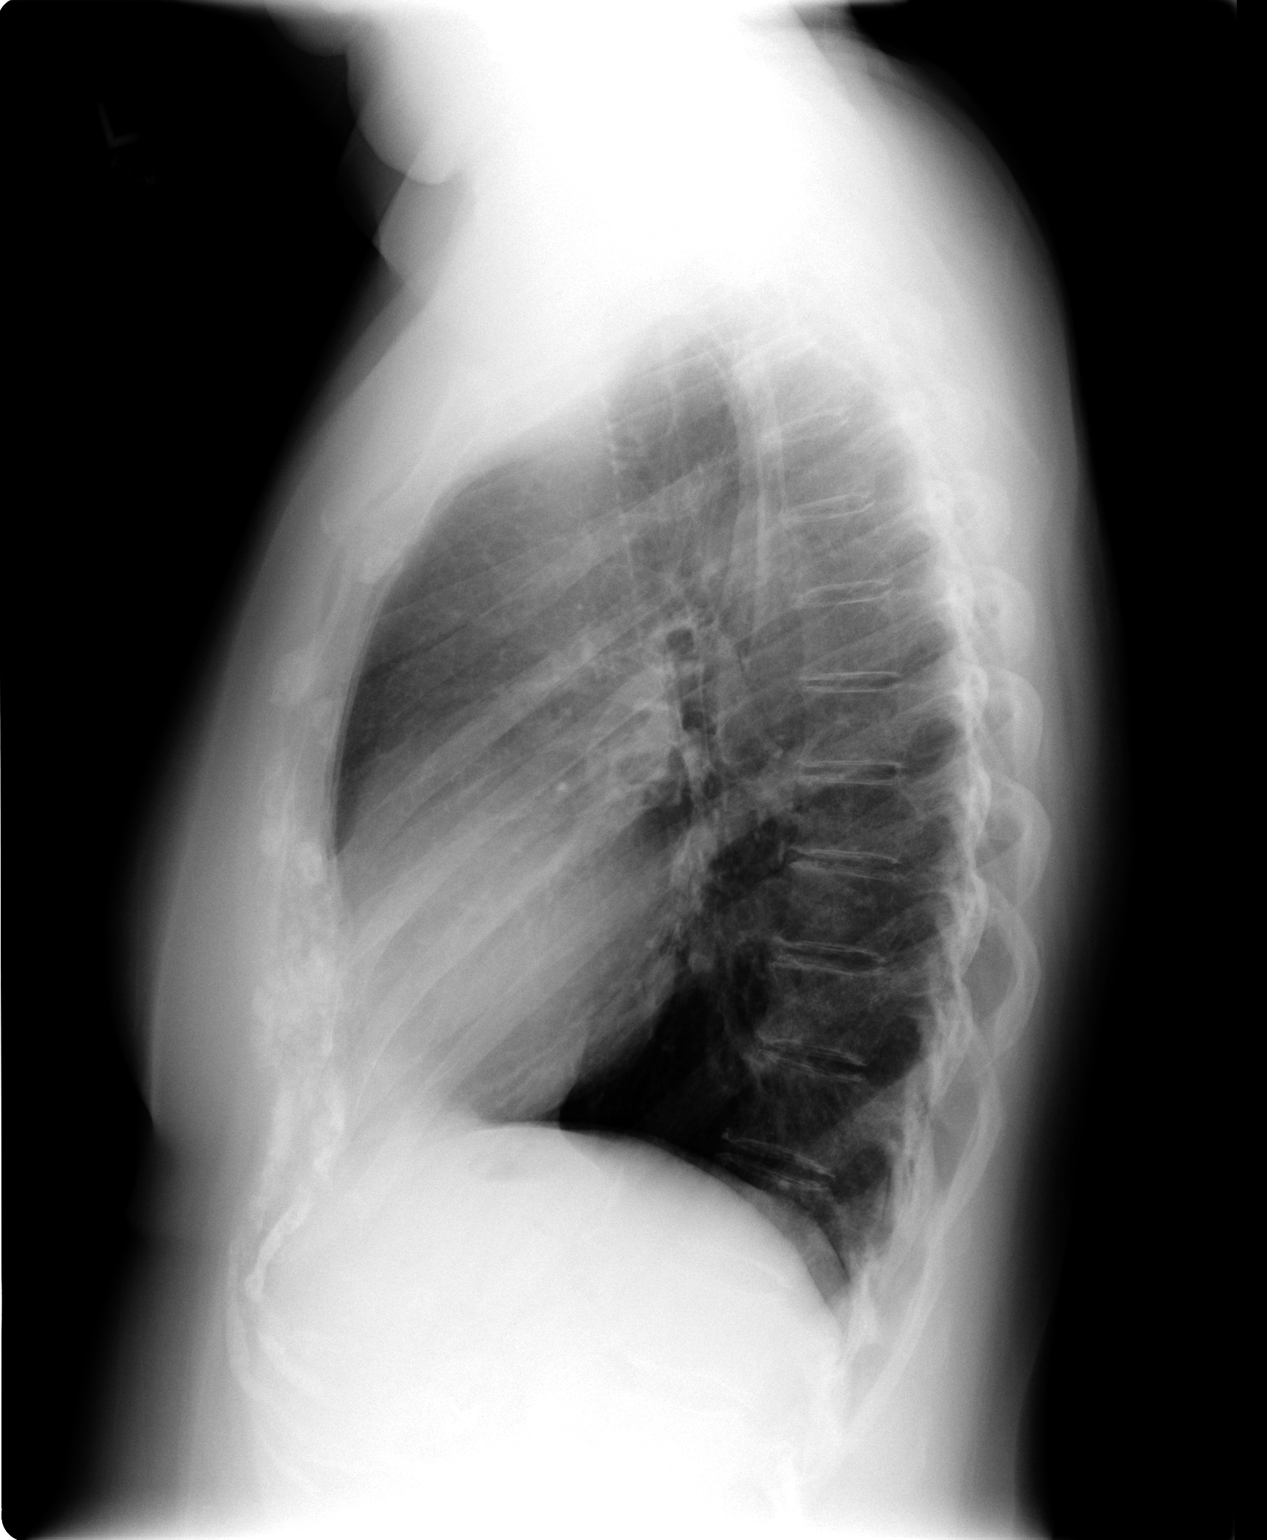

[2 of 2 positions shown; findings below may reference images not displayed]

FINDINGS: Stable lung volumes.  Cardiac size and mediastinal
contours are within normal limits.  Visualized tracheal air column
is within normal limits.  Stable mild apical scarring.  No
pneumothorax, pulmonary edema, pleural effusion or acute pulmonary
opacity. No acute osseous abnormality identified.  Stable right
upper quadrant surgical clips.
IMPRESSION: No acute cardiopulmonary abnormality.

## 2013-04-10 IMAGING — CR DG RIBS 2V*L*
2 series · 2 of 2 positions shown · non-contrast
Comparison: Chest radiograph from the same day and earlier.

CLINICAL DATA: 79-year-old female with left lower rib/flank pain
times 2 weeks.

LEFT RIBS - 2 VIEW

[view not recorded (1 of 2)]
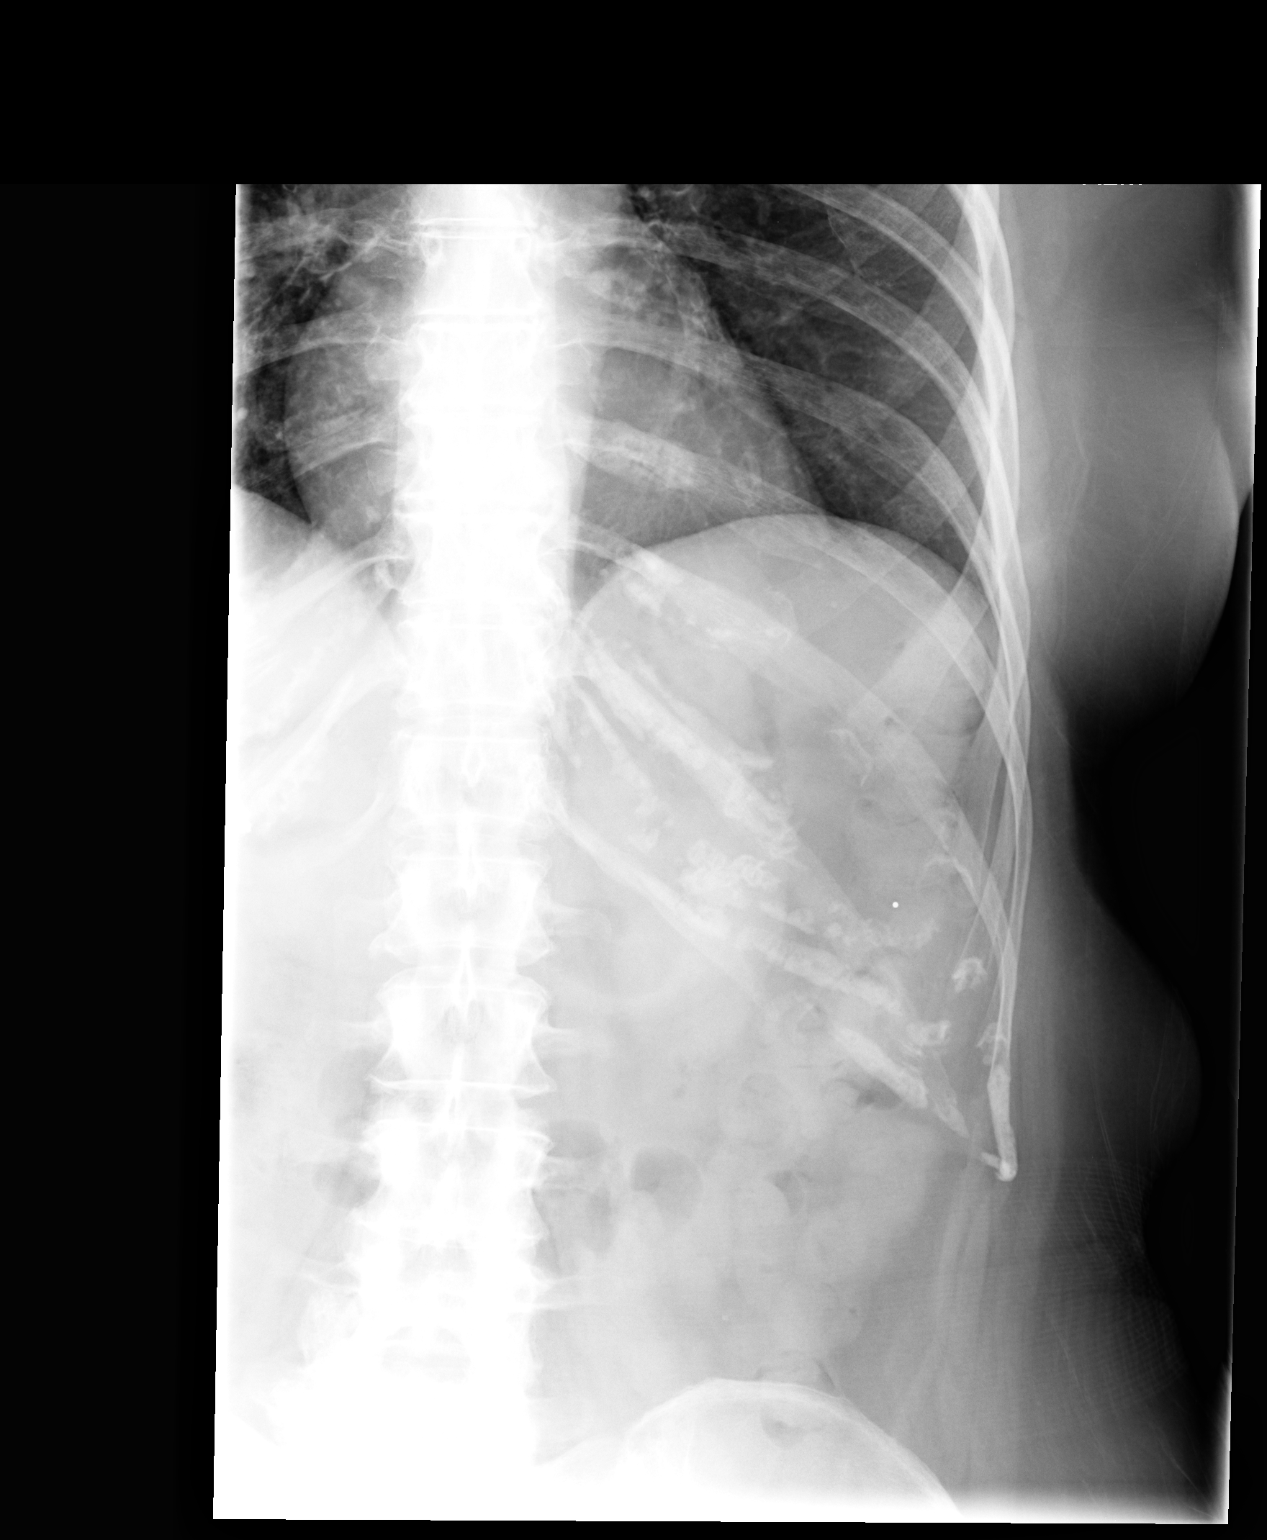

[view not recorded (2 of 2)]
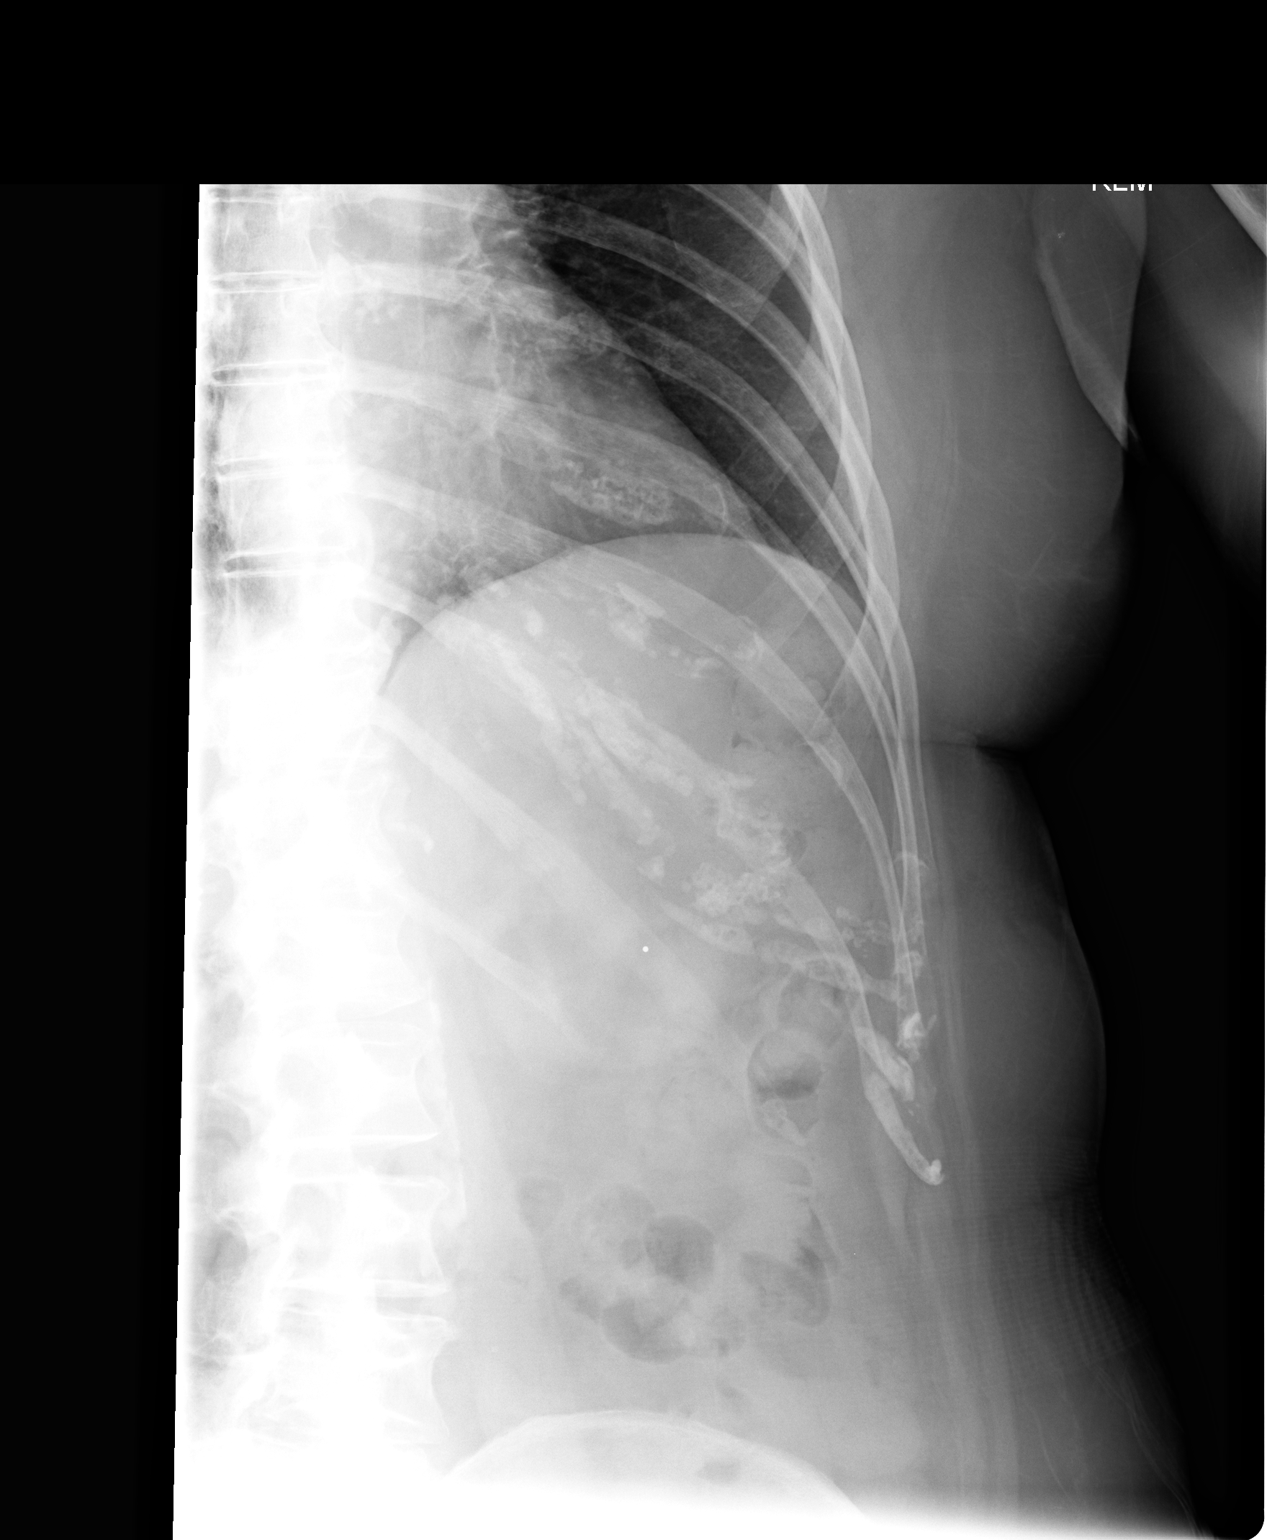

[2 of 2 positions shown; findings below may reference images not displayed]

FINDINGS: Incidental irregular costochondral calcifications of the
left lower ribs.  BB marker at the level of clinical concern
projects over the costochondral calcifications, and the posterior
left 11th rib.  No displaced rib fracture identified.  Visualized
osseous structures appear intact.  Negative left lung base.
IMPRESSION: No acute left lower rib fracture identified.

## 2013-04-14 IMAGING — US US RENAL
1 series · 14 of 25 positions shown · non-contrast
Comparison: CT of the abdomen and pelvis 08/31/2010

CLINICAL DATA: Left flank pain.

RENAL/URINARY TRACT ULTRASOUND COMPLETE

[Series 1: us renal · 0.23mm/px · 14 of 36 slices shown]
[im 1/36]
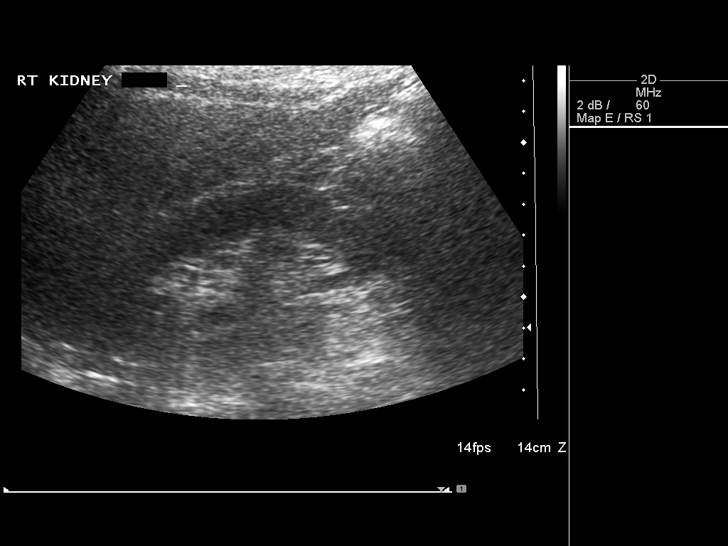
[im 3/36]
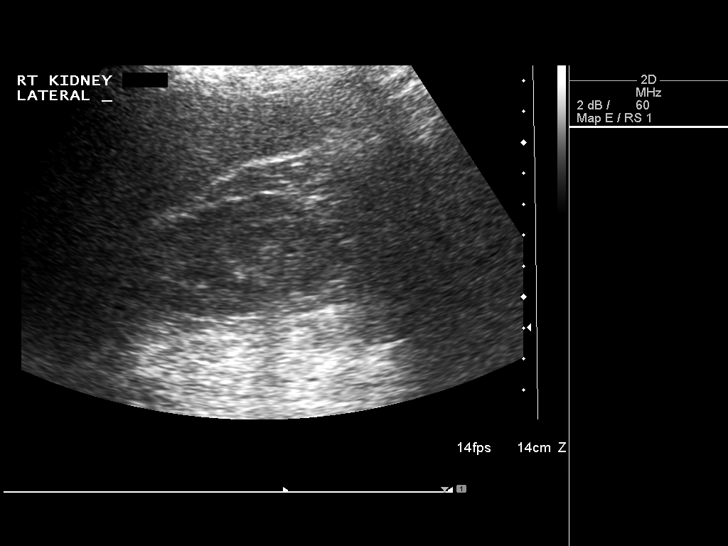
[im 6/36]
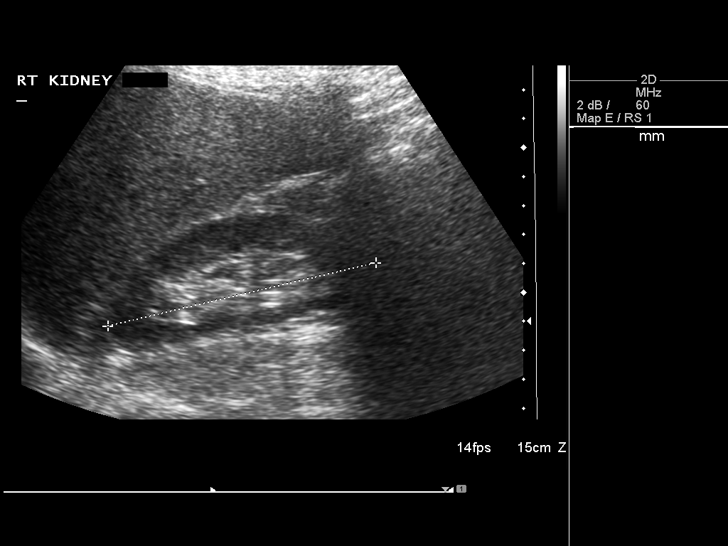
[im 9/36]
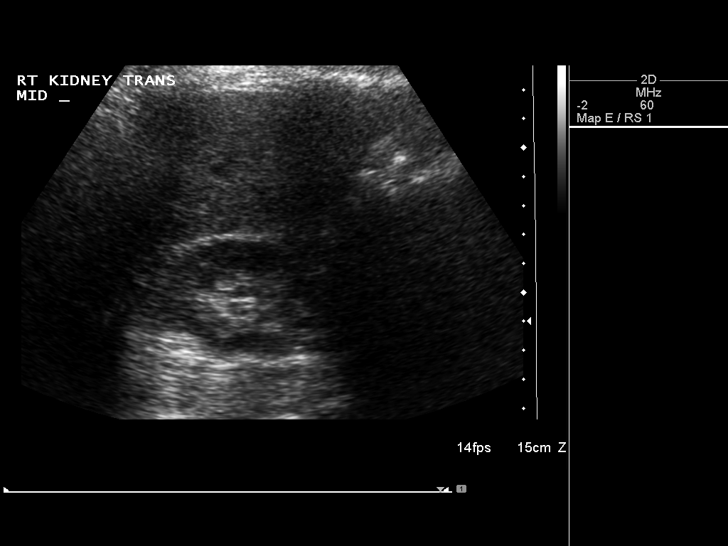
[im 12/36]
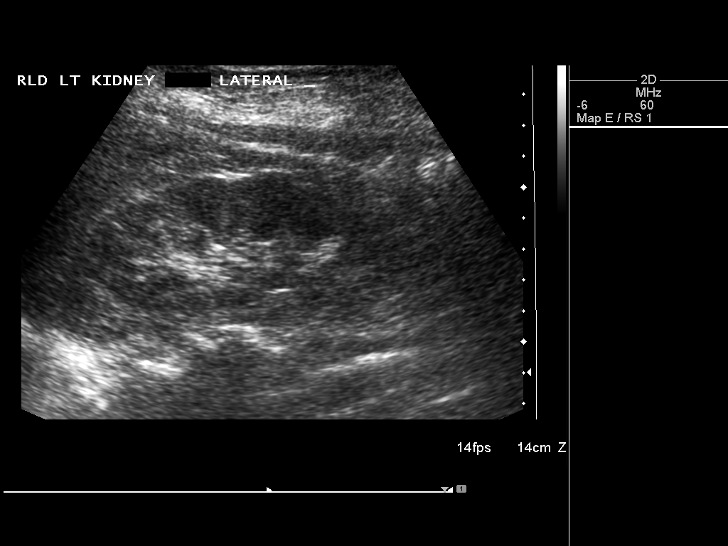
[im 14/36]
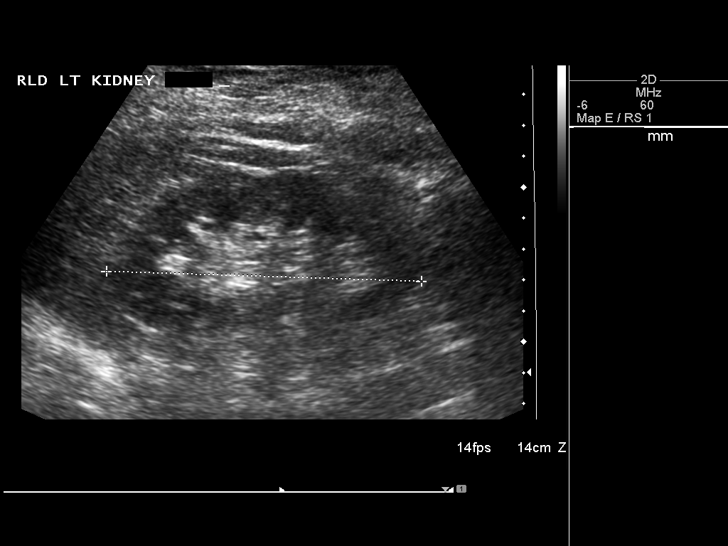
[im 17/36]
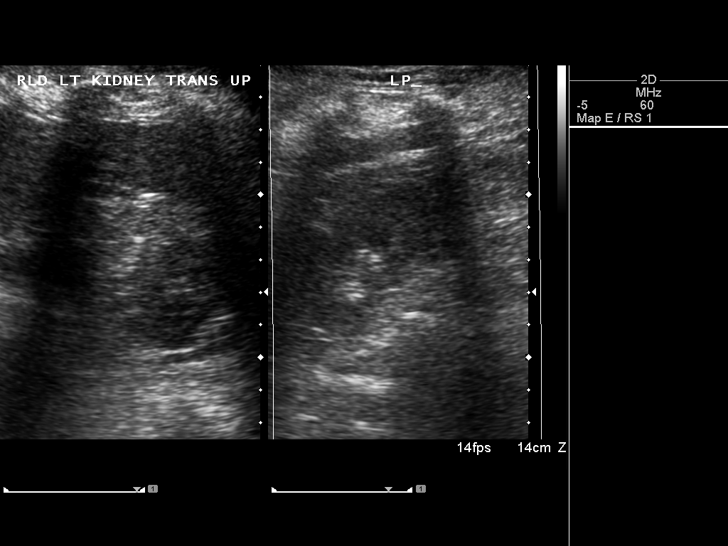
[im 19/36]
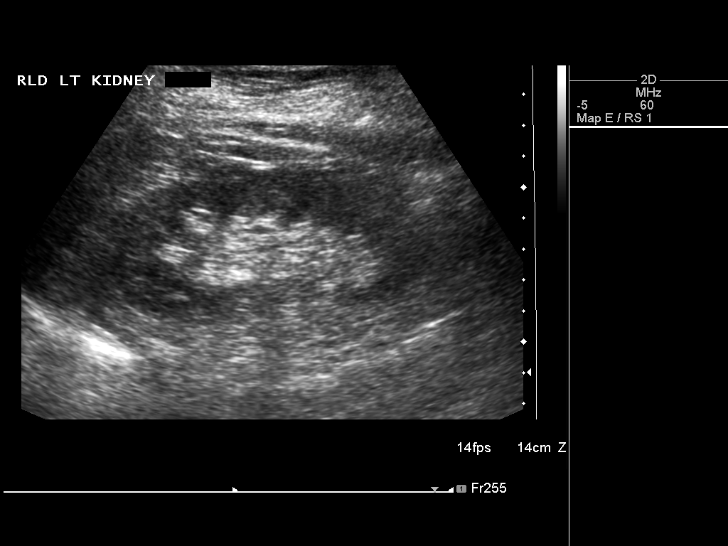
[im 22/36]
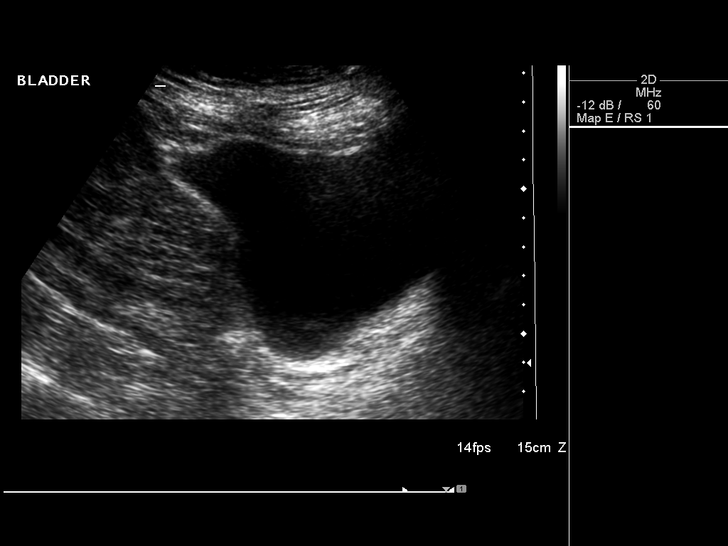
[im 24/36]
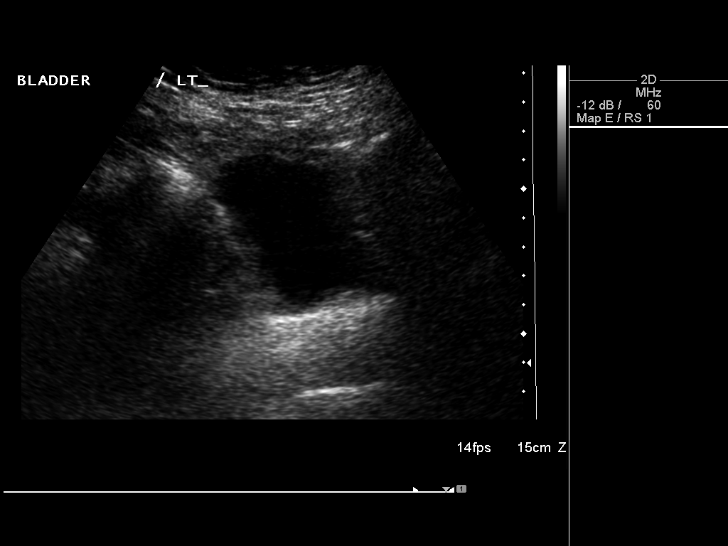
[im 27/36]
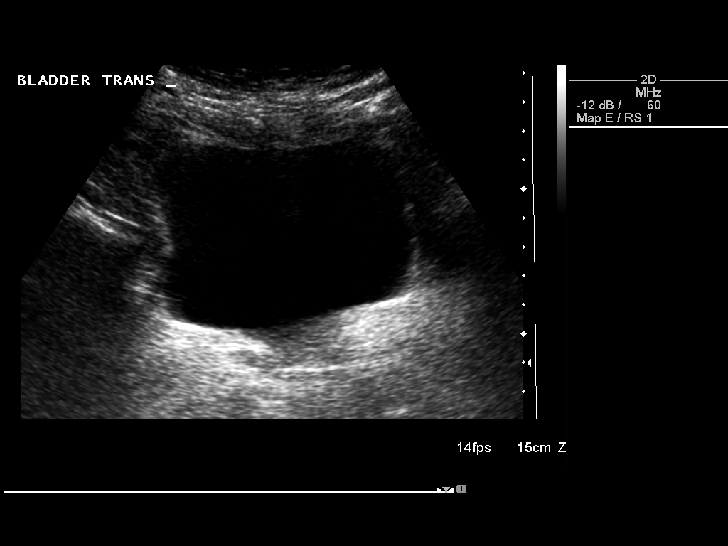
[im 30/36]
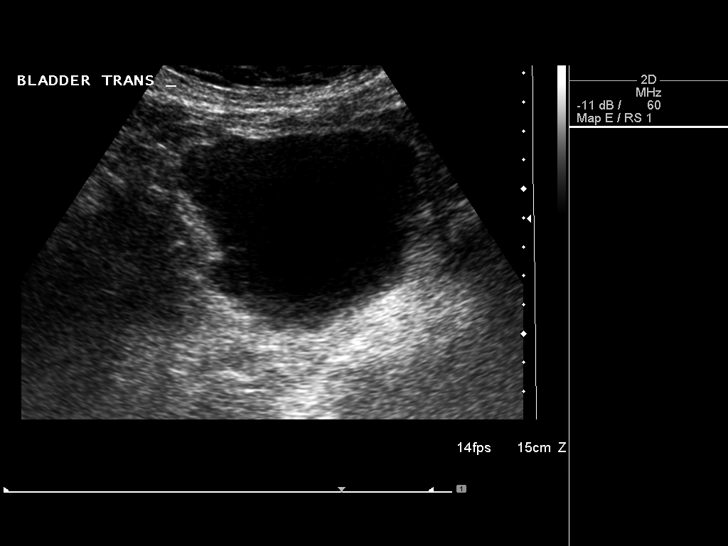
[im 33/36]
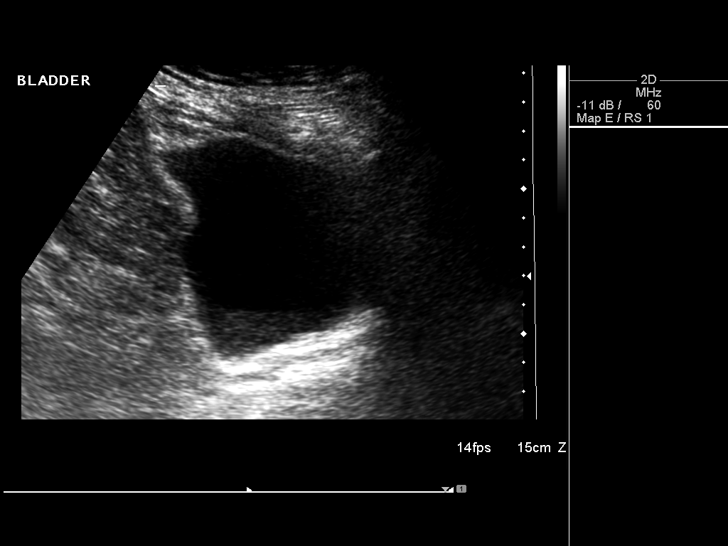
[im 36/36]
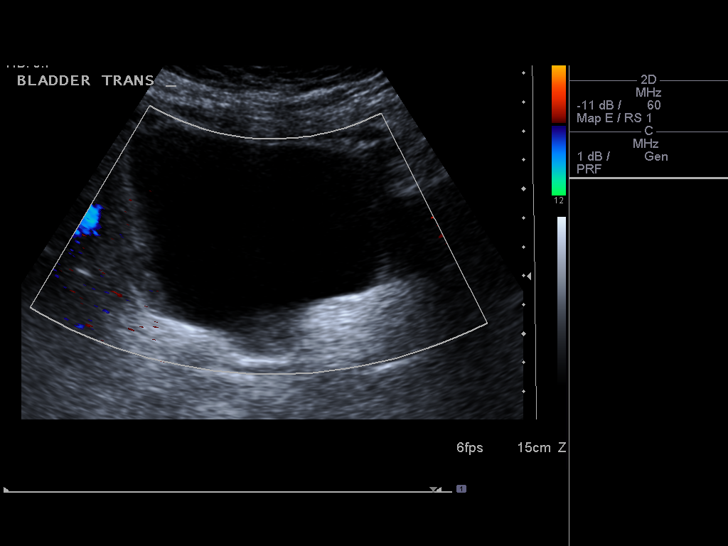

[14 of 25 positions shown; findings below may reference images not displayed]

FINDINGS: Right Kidney:  9.5 cm in length.  Visualization of the lower pole
is limited secondary to overlying bowel gas.  However, no mass or
pelvicaliectasis identified.

Left Kidney:  11.0 cm in length.  No pelvicaliectasis or mass
identified.

Bladder:  There are dependent echoes within the bladder, raising
the question of debris or blood.
IMPRESSION: 1.  No evidence for hydronephrosis.
2.  Debris versus blood within the bladder.

## 2013-04-16 ENCOUNTER — Ambulatory Visit (INDEPENDENT_AMBULATORY_CARE_PROVIDER_SITE_OTHER): Payer: Medicare Other | Admitting: Cardiology

## 2013-04-16 ENCOUNTER — Encounter: Payer: Self-pay | Admitting: Cardiology

## 2013-04-16 VITALS — BP 142/75 | HR 72 | Ht 63.5 in | Wt 155.0 lb

## 2013-04-16 DIAGNOSIS — I471 Supraventricular tachycardia, unspecified: Secondary | ICD-10-CM

## 2013-04-16 NOTE — Patient Instructions (Signed)
The current medical regimen is effective;  continue present plan and medications.  Follow up as needed with Dr Hochrein 

## 2013-04-16 NOTE — Progress Notes (Signed)
HPI I last saw her she says 2012. At that time she had a negative stress perfusion study with a well preserved ejection fraction.  She had some palpitations and chest discomfort at that time. She's been doing relatively well with beta blockers although the dose was increased not too long ago.  She has rarely needed to take an extra dose of atenolol on top of her daily dose. In early December she had some dizziness and I reviewed ER records. She had a negative MRI and no clear etiology. In mid-December she had palpitations. They were particularly severe that day. She presented to the emergency room where she was seen in consultation by Dr. Tenny Craw.  She is wearing an event monitor but there has been no significant dysrhythmias seen she's been wearing the monitor by her report. She said the palpitations are less frequent although they happen very couple of days.  She describes skipped beats. She may have 4 in a row. Then it might go away. She does have presyncope or syncope. She doesn't describe sustained tachypalpitations. He does have some tightness in her shoulders and upper chest that she's had this for a long time and relates it to stress. She's not had any new shortness of breath, PND or orthopnea. She's not had any new weight gain or edema.  Allergies  Allergen Reactions  . Codeine Nausea Only  . Erythromycin Nausea Only  . Metronidazole Nausea Only  . Pseudoephedrine Other (See Comments)    Skin crawling  . Sertraline Hcl Other (See Comments)    Increased depression  . Venlafaxine Other (See Comments)    Increased depression    Current Outpatient Prescriptions  Medication Sig Dispense Refill  . aspirin 325 MG tablet Take 325 mg by mouth every 4 (four) hours as needed.       Marland Kitchen aspirin 81 MG tablet Take 81 mg by mouth daily.        . Calcium Carbonate-Vitamin D (CALCIUM 600 + D PO) Take by mouth daily.      . clorazepate (TRANXENE) 3.75 MG tablet Take 3.75 mg by mouth daily.        .  dorzolamide-timolol (COSOPT) 22.3-6.8 MG/ML ophthalmic solution Place 1 drop into both eyes 2 (two) times daily.      . fexofenadine (ALLEGRA) 180 MG tablet Take 180 mg by mouth daily as needed for allergies or rhinitis.      . fluticasone (FLONASE) 50 MCG/ACT nasal spray 2 sprays by Nasal route as directed.        . meclizine (ANTIVERT) 25 MG tablet Take 1 tablet (25 mg total) by mouth 3 (three) times daily as needed for dizziness.  15 tablet  0  . metoprolol tartrate (LOPRESSOR) 25 MG tablet Take 25 mg by mouth daily. Sometimes twice a day      . Multiple Vitamin (MULTIVITAMIN) tablet Take 1 tablet by mouth daily.       . naproxen sodium (ANAPROX) 220 MG tablet Take 220 mg by mouth 2 (two) times daily as needed (for pain).      . Omega-3 Fatty Acids (FISH OIL) 1000 MG CAPS Take 1 capsule by mouth daily. Takes 1/2 tab      . pantoprazole (PROTONIX) 40 MG tablet Take 40 mg by mouth daily.        . rosuvastatin (CRESTOR) 5 MG tablet Take 5 mg by mouth daily.        Marland Kitchen sulfamethoxazole-trimethoprim (BACTRIM DS) 800-160 MG per tablet  No current facility-administered medications for this visit.    Past Medical History  Diagnosis Date  . Hyperlipidemia     mixed  . SVT (supraventricular tachycardia)   . PSVT (paroxysmal supraventricular tachycardia)   . PAT (paroxysmal atrial tachycardia)   . GERD (gastroesophageal reflux disease)     Past Surgical History  Procedure Laterality Date  . Breast surgery      biopsy  . Cardioversion    . Cardiac electrophysiology mapping and ablation    . Abdominal hysterectomy    . Appendectomy    . Tonsillectomy    . Cholecystectomy      ROS:   As stated in the HPI and negative for all other systems.   PHYSICAL EXAM BP 142/75  Pulse 72  Ht 5' 3.5" (1.613 m)  Wt 155 lb (70.308 kg)  BMI 27.02 kg/m2 GENERAL:  Well appearing HEENT:  Pupils equal round and reactive, fundi not visualized, oral mucosa unremarkable NECK:  No jugular venous  distention, waveform within normal limits, carotid upstroke brisk and symmetric, no bruits, no thyromegaly LUNGS:  Clear to auscultation bilaterally CHEST:  Unremarkable HEART:  PMI not displaced or sustained,S1 and S2 within normal limits, no S3, no S4, no clicks, no rubs, no  murmurs ABD:  Flat, positive bowel sounds normal in frequency in pitch, no bruits, no rebound, no guarding, no midline pulsatile mass, no hepatomegaly, no splenomegaly EXT:  2 plus pulses throughout, no edema, no cyanosis no clubbing    ASSESSMENT AND PLAN  PALPITATIONS:  I reviewed the emergency room record we discussed options again. I think the therapy would be to either continue current beta blocker or perhaps switch to a calcium channel blocker. She will think this over and let me know what she wants to do. I don't think further cardiovascular testing however is suggested.  We did discuss relaxation techniques as well.  HTN:  BP is mildly elevated.  However, this is unusual.  No change in therapy is indicated.

## 2013-04-20 ENCOUNTER — Encounter: Payer: Self-pay | Admitting: Cardiology

## 2013-05-01 ENCOUNTER — Encounter: Payer: Self-pay | Admitting: Cardiology

## 2013-05-03 ENCOUNTER — Encounter: Payer: Self-pay | Admitting: Cardiology

## 2013-05-03 ENCOUNTER — Ambulatory Visit (INDEPENDENT_AMBULATORY_CARE_PROVIDER_SITE_OTHER): Payer: Medicare Other | Admitting: Cardiology

## 2013-05-03 VITALS — BP 132/62 | HR 68 | Ht 63.5 in | Wt 155.0 lb

## 2013-05-03 DIAGNOSIS — R002 Palpitations: Secondary | ICD-10-CM

## 2013-05-03 MED ORDER — DILTIAZEM HCL 30 MG PO TABS: 30.0000 mg | ORAL_TABLET | Freq: Two times a day (BID) | ORAL | Status: DC

## 2013-05-03 NOTE — Patient Instructions (Signed)
Please stop your Metoprolol. Start Cardizem (immediate release) 30 mg twice a day. Continue all other medications as listed.  Follow up in 2 months with Dr Antoine PocheHochrein.

## 2013-05-03 NOTE — Progress Notes (Signed)
HPI The patient has had a history of chest pain and palpitations. In 2012 she had anegative stress perfusion study with a well preserved ejection fraction. In early December she had some dizziness and I reviewed ER records. She had a negative MRI and no clear etiology. In mid-December she had palpitations. They were particularly severe that day. She presented to the emergency room where she was seen in consultation by Dr. Tenny Craw.  She is wearing an event monitor but there has been no significant dysrhythmias seen she's been wearing the monitor by her report.   Since I last saw her in the office her monitor was completed. I have reviewed extensive strips and she had very rare PACs. There were no further dysrhythmias documented. Unfortunately she has not tolerated 25 mg twice a day and metoprolol. She said that this made her feel "like a zombie". She was too fatigued to carry on her daily activities. She's still getting the palpitations frequently. Sometimes they wake her from her sleep. She's not had any presyncope or syncope. She does have some tightness in her shoulders and chest but relates this to stress. She has no new shortness of breath, PND or orthopnea.    Allergies  Allergen Reactions  . Codeine Nausea Only  . Erythromycin Nausea Only  . Metronidazole Nausea Only  . Pseudoephedrine Other (See Comments)    Skin crawling  . Sertraline Hcl Other (See Comments)    Increased depression  . Venlafaxine Other (See Comments)    Increased depression    Current Outpatient Prescriptions  Medication Sig Dispense Refill  . aspirin 325 MG tablet Take 325 mg by mouth every 4 (four) hours as needed.       Marland Kitchen aspirin 81 MG tablet Take 81 mg by mouth daily.        . Calcium Carbonate-Vitamin D (CALCIUM 600 + D PO) Take by mouth daily.      . clorazepate (TRANXENE) 3.75 MG tablet Take 3.75 mg by mouth daily.        . dorzolamide-timolol (COSOPT) 22.3-6.8 MG/ML ophthalmic solution Place 1 drop into  both eyes 2 (two) times daily.      . fexofenadine (ALLEGRA) 180 MG tablet Take 180 mg by mouth daily as needed for allergies or rhinitis.      . fluticasone (FLONASE) 50 MCG/ACT nasal spray 2 sprays by Nasal route as directed.        . meclizine (ANTIVERT) 25 MG tablet Take 1 tablet (25 mg total) by mouth 3 (three) times daily as needed for dizziness.  15 tablet  0  . metoprolol tartrate (LOPRESSOR) 25 MG tablet Take 25 mg by mouth daily. Sometimes twice a day      . Multiple Vitamin (MULTIVITAMIN) tablet Take 1 tablet by mouth daily.       . naproxen sodium (ANAPROX) 220 MG tablet Take 220 mg by mouth 2 (two) times daily as needed (for pain).      . Omega-3 Fatty Acids (FISH OIL) 1000 MG CAPS Take 1 capsule by mouth daily. Takes 1/2 tab      . pantoprazole (PROTONIX) 40 MG tablet Take 40 mg by mouth daily.        . rosuvastatin (CRESTOR) 5 MG tablet Take 5 mg by mouth daily.        Marland Kitchen sulfamethoxazole-trimethoprim (BACTRIM DS) 800-160 MG per tablet        No current facility-administered medications for this visit.    Past Medical History  Diagnosis  Date  . Hyperlipidemia     mixed  . SVT (supraventricular tachycardia)   . PSVT (paroxysmal supraventricular tachycardia)   . PAT (paroxysmal atrial tachycardia)   . GERD (gastroesophageal reflux disease)     Past Surgical History  Procedure Laterality Date  . Breast surgery      biopsy  . Cardioversion    . Cardiac electrophysiology mapping and ablation    . Abdominal hysterectomy    . Appendectomy    . Tonsillectomy    . Cholecystectomy      ROS:   As stated in the HPI and negative for all other systems.   PHYSICAL EXAM BP 132/62  Pulse 68  Ht 5' 3.5" (1.613 m)  Wt 155 lb (70.308 kg)  BMI 27.02 kg/m2 GENERAL:  Well appearing HEENT:  Pupils equal round and reactive, fundi not visualized, oral mucosa unremarkable NECK:  No jugular venous distention, waveform within normal limits, carotid upstroke brisk and symmetric, no  bruits, no thyromegaly LUNGS:  Clear to auscultation bilaterally CHEST:  Unremarkable HEART:  PMI not displaced or sustained,S1 and S2 within normal limits, no S3, no S4, no clicks, no rubs, no  murmurs ABD:  Flat, positive bowel sounds normal in frequency in pitch, no bruits, no rebound, no guarding, no midline pulsatile mass, no hepatomegaly, no splenomegaly EXT:  2 plus pulses throughout, no edema, no cyanosis no clubbing   ASSESSMENT AND PLAN  PALPITATIONS:  She did not tolerate the higher dose of beta blocker. Therefore, I will stop her current beta blocker and try a low dose of Cardizem immediate release 30 mg twice a day.  HTN:  BP is mildly controlled.   No change in therapy is indicated.

## 2013-05-22 ENCOUNTER — Encounter: Payer: Self-pay | Admitting: Cardiology

## 2013-05-27 ENCOUNTER — Encounter: Payer: Self-pay | Admitting: Cardiology

## 2013-05-28 ENCOUNTER — Other Ambulatory Visit: Payer: Self-pay | Admitting: *Deleted

## 2013-05-28 MED ORDER — DILTIAZEM HCL 30 MG PO TABS: 30.0000 mg | ORAL_TABLET | Freq: Three times a day (TID) | ORAL | Status: DC

## 2013-07-09 ENCOUNTER — Ambulatory Visit (INDEPENDENT_AMBULATORY_CARE_PROVIDER_SITE_OTHER): Payer: Medicare Other | Admitting: Cardiology

## 2013-07-09 ENCOUNTER — Encounter: Payer: Self-pay | Admitting: Cardiology

## 2013-07-09 VITALS — BP 126/66 | HR 64 | Ht 63.5 in | Wt 155.4 lb

## 2013-07-09 DIAGNOSIS — R002 Palpitations: Secondary | ICD-10-CM

## 2013-07-09 DIAGNOSIS — R0989 Other specified symptoms and signs involving the circulatory and respiratory systems: Secondary | ICD-10-CM

## 2013-07-09 DIAGNOSIS — I471 Supraventricular tachycardia: Secondary | ICD-10-CM

## 2013-07-09 NOTE — Patient Instructions (Signed)
The current medical regimen is effective;  continue present plan and medications.  Your physician has requested that you have a carotid duplex. This test is an ultrasound of the carotid arteries in your neck. It looks at blood flow through these arteries that supply the brain with blood. Allow one hour for this exam. There are no restrictions or special instructions.  Follow up in 6 months with Dr Hochrein.  You will receive a letter in the mail 2 months before you are due.  Please call us when you receive this letter to schedule your follow up appointment.  

## 2013-07-09 NOTE — Progress Notes (Signed)
HPI The patient has had a history of chest pain and palpitations. In 2012 she had a negative stress perfusion study with a well preserved ejection fraction. Last year she had palpitations and wore an event monitor but there were no significant dysrhythmias seen.   Unfortunately she has not tolerated 25 mg twice a day and metoprolol. She said that this made her feel "like a zombie". She was too fatigued to carry on her daily activities.  We then tried a low dose of Cardizem.    She is doing much better now.  We did adjust from bid to tid.  She has rare palpitations.  The patient denies any new symptoms such as chest discomfort, neck or arm discomfort. There has been no new shortness of breath, PND or orthopnea. There have been no reported palpitations, presyncope or syncope.  She is having a pain in her neck and head.     Allergies  Allergen Reactions  . Codeine Nausea Only  . Erythromycin Nausea Only  . Metronidazole Nausea Only  . Pseudoephedrine Other (See Comments)    Skin crawling  . Sertraline Hcl Other (See Comments)    Increased depression  . Venlafaxine Other (See Comments)    Increased depression    Current Outpatient Prescriptions  Medication Sig Dispense Refill  . aspirin 81 MG tablet Take 81 mg by mouth daily.        . Calcium Carbonate-Vitamin D (CALCIUM 600 + D PO) Take by mouth daily.      . clorazepate (TRANXENE) 3.75 MG tablet Take 3.75 mg by mouth daily.        Marland Kitchen. diltiazem (CARDIZEM) 30 MG tablet Take 1 tablet (30 mg total) by mouth 3 (three) times daily.  90 tablet  11  . dorzolamide-timolol (COSOPT) 22.3-6.8 MG/ML ophthalmic solution Place 1 drop into both eyes 2 (two) times daily.      Marland Kitchen. estradiol (ESTRACE) 0.1 MG/GM vaginal cream Place 1 Applicatorful vaginally as needed.      . fexofenadine (ALLEGRA) 180 MG tablet Take 180 mg by mouth daily as needed for allergies or rhinitis.      . fluticasone (FLONASE) 50 MCG/ACT nasal spray 2 sprays by Nasal route as  directed.        . meclizine (ANTIVERT) 25 MG tablet Take 1 tablet (25 mg total) by mouth 3 (three) times daily as needed for dizziness.  15 tablet  0  . Multiple Vitamin (MULTIVITAMIN) tablet Take 1 tablet by mouth daily.       . naproxen sodium (ANAPROX) 220 MG tablet Take 220 mg by mouth 2 (two) times daily as needed (for pain).      . Omega-3 Fatty Acids (FISH OIL) 1000 MG CAPS Take 1 capsule by mouth daily. Takes 1/2 tab      . pantoprazole (PROTONIX) 40 MG tablet Take 40 mg by mouth daily.        . rosuvastatin (CRESTOR) 5 MG tablet Take 5 mg by mouth daily.         No current facility-administered medications for this visit.    Past Medical History  Diagnosis Date  . Hyperlipidemia     mixed  . SVT (supraventricular tachycardia)   . PSVT (paroxysmal supraventricular tachycardia)   . PAT (paroxysmal atrial tachycardia)   . GERD (gastroesophageal reflux disease)     Past Surgical History  Procedure Laterality Date  . Breast surgery      biopsy  . Cardioversion    . Cardiac  electrophysiology mapping and ablation    . Abdominal hysterectomy    . Appendectomy    . Tonsillectomy    . Cholecystectomy      ROS:   As stated in the HPI and negative for all other systems.   PHYSICAL EXAM BP 126/66  Pulse 64  Ht 5' 3.5" (1.613 m)  Wt 155 lb 6.4 oz (70.489 kg)  BMI 27.09 kg/m2 GENERAL:  Well appearing HEENT:  Pupils equal round and reactive, fundi not visualized, oral mucosa unremarkable NECK:  No jugular venous distention, waveform within normal limits, carotid upstroke brisk and symmetric, left soft bruit, no thyromegaly LUNGS:  Clear to auscultation bilaterally CHEST:  Unremarkable HEART:  PMI not displaced or sustained,S1 and S2 within normal limits, no S3, no S4, no clicks, no rubs, no  murmurs ABD:  Flat, positive bowel sounds normal in frequency in pitch, no bruits, no rebound, no guarding, no midline pulsatile mass, no hepatomegaly, no splenomegaly EXT:  2 plus  pulses throughout, no edema, no cyanosis no clubbing, right femoral bruit  EKG:  Sinus rhythm, rate 64, axis within normal limits, intervals within normal limits, no acute ST-T wave changes.  07/09/2013  ASSESSMENT AND PLAN  PALPITATIONS:  She is doing much better on the Cardizem.  She will continue with this.    HTN:  BP is mildly controlled.   No change in therapy is indicated.  BRUIT:  This is a new finding.  I will order a Doppler.

## 2013-07-12 ENCOUNTER — Ambulatory Visit (HOSPITAL_COMMUNITY): Payer: Medicare Other | Attending: Cardiology | Admitting: Cardiology

## 2013-07-12 DIAGNOSIS — R0989 Other specified symptoms and signs involving the circulatory and respiratory systems: Secondary | ICD-10-CM

## 2013-07-13 NOTE — Progress Notes (Signed)
Carotid duplex performed 

## 2013-07-16 ENCOUNTER — Encounter: Payer: Self-pay | Admitting: Cardiology

## 2013-10-31 ENCOUNTER — Other Ambulatory Visit: Payer: Self-pay | Admitting: *Deleted

## 2013-10-31 ENCOUNTER — Ambulatory Visit (INDEPENDENT_AMBULATORY_CARE_PROVIDER_SITE_OTHER): Payer: Medicare Other | Admitting: Cardiology

## 2013-10-31 ENCOUNTER — Encounter: Payer: Self-pay | Admitting: Cardiology

## 2013-10-31 VITALS — BP 120/62 | HR 69 | Ht 63.0 in | Wt 155.0 lb

## 2013-10-31 DIAGNOSIS — R002 Palpitations: Secondary | ICD-10-CM

## 2013-10-31 DIAGNOSIS — I471 Supraventricular tachycardia: Secondary | ICD-10-CM

## 2013-10-31 DIAGNOSIS — E785 Hyperlipidemia, unspecified: Secondary | ICD-10-CM

## 2013-10-31 MED ORDER — DILTIAZEM HCL 30 MG PO TABS: 30.0000 mg | ORAL_TABLET | Freq: Four times a day (QID) | ORAL | Status: DC

## 2013-10-31 NOTE — Progress Notes (Signed)
HPI The patient has had a history of chest pain and palpitations. In 2012 she had a negative stress perfusion study with a well preserved ejection fraction. Last year she had palpitations and wore an event monitor but there were no significant dysrhythmias seen.   Unfortunately she has not tolerated 25 mg twice a day and metoprolol. She said that this made her feel "like a zombie". She was too fatigued to carry on her daily activities.  We then tried a low dose of Cardizem.  For a while she did much better. However, she's not having nighttime palpitations predominantly. These can wake her from her sleep. She has some during the day but they're not as severe.   Allergies  Allergen Reactions  . Codeine Nausea Only  . Erythromycin Nausea Only  . Metronidazole Nausea Only  . Pseudoephedrine Other (See Comments)    Skin crawling  . Sertraline Hcl Other (See Comments)    Increased depression  . Venlafaxine Other (See Comments)    Increased depression    Current Outpatient Prescriptions  Medication Sig Dispense Refill  . aspirin 81 MG tablet Take 81 mg by mouth daily.        . Calcium Carbonate-Vitamin D (CALCIUM 600 + D PO) Take by mouth daily.      . clorazepate (TRANXENE) 3.75 MG tablet Take 3.75 mg by mouth daily.        Marland Kitchen diltiazem (CARDIZEM) 30 MG tablet Take 1 tablet (30 mg total) by mouth 3 (three) times daily.  90 tablet  11  . dorzolamide-timolol (COSOPT) 22.3-6.8 MG/ML ophthalmic solution Place 1 drop into both eyes 2 (two) times daily.      Marland Kitchen estradiol (ESTRACE) 0.1 MG/GM vaginal cream Place 1 Applicatorful vaginally as needed.      . fexofenadine (ALLEGRA) 180 MG tablet Take 180 mg by mouth daily as needed for allergies or rhinitis.      Marland Kitchen meclizine (ANTIVERT) 25 MG tablet Take 1 tablet (25 mg total) by mouth 3 (three) times daily as needed for dizziness.  15 tablet  0  . Multiple Vitamin (MULTIVITAMIN) tablet Take 1 tablet by mouth daily.       . naproxen sodium (ANAPROX) 220  MG tablet Take 220 mg by mouth 2 (two) times daily as needed (for pain).      . Omega-3 Fatty Acids (FISH OIL) 1000 MG CAPS Take 1 capsule by mouth daily. Takes 1/2 tab      . pantoprazole (PROTONIX) 40 MG tablet Take 40 mg by mouth daily.        . rosuvastatin (CRESTOR) 5 MG tablet Take 5 mg by mouth daily.        Marland Kitchen triamcinolone (NASACORT ALLERGY 24HR) 55 MCG/ACT AERO nasal inhaler As directed       No current facility-administered medications for this visit.    Past Medical History  Diagnosis Date  . Hyperlipidemia     mixed  . SVT (supraventricular tachycardia)   . PSVT (paroxysmal supraventricular tachycardia)   . PAT (paroxysmal atrial tachycardia)   . GERD (gastroesophageal reflux disease)     Past Surgical History  Procedure Laterality Date  . Breast surgery      biopsy  . Cardioversion    . Cardiac electrophysiology mapping and ablation    . Abdominal hysterectomy    . Appendectomy    . Tonsillectomy    . Cholecystectomy      ROS:   As stated in the HPI and negative for  all other systems.   PHYSICAL EXAM BP 120/62  Pulse 69  Ht 5\' 3"  (1.6 m)  Wt 155 lb (70.308 kg)  BMI 27.46 kg/m2 GENERAL:  Well appearing HEENT:  Pupils equal round and reactive, fundi not visualized, oral mucosa unremarkable NECK:  No jugular venous distention, waveform within normal limits, carotid upstroke brisk and symmetric, left soft bruit, no thyromegaly LUNGS:  Clear to auscultation bilaterally CHEST:  Unremarkable HEART:  PMI not displaced or sustained,S1 and S2 within normal limits, no S3, no S4, no clicks, no rubs, no  murmurs ABD:  Flat, positive bowel sounds normal in frequency in pitch, no bruits, no rebound, no guarding, no midline pulsatile mass, no hepatomegaly, no splenomegaly EXT:  2 plus pulses throughout, no edema, no cyanosis no clubbing, right femoral bruit  EKG:  Sinus rhythm, rate 69, axis within normal limits, intervals within normal limits, no acute ST-T wave  changes.  10/31/2013  ASSESSMENT AND PLAN  PALPITATIONS:  Given the fact her palpitations are more at night we will try to increase the Cardizem by adding another 30 mg nightly. She'll let me know how she does with this. I also asked her to discuss with Dr. Katrinka BlazingSmith the possibility of something short-acting for anxiety with his nighttime episodes.  HTN:  BP is mildly controlled.   No change in therapy is indicated.  BRUIT:  She had no stenosis on Doppler. No change in therapy is indicated.

## 2013-10-31 NOTE — Patient Instructions (Signed)
Your physician recommends that you schedule a follow-up appointment in: with your regular scheduled appt. In OCT

## 2013-11-07 ENCOUNTER — Encounter: Payer: Self-pay | Admitting: Cardiology

## 2013-12-18 ENCOUNTER — Other Ambulatory Visit: Payer: Self-pay

## 2013-12-18 DIAGNOSIS — Z1231 Encounter for screening mammogram for malignant neoplasm of breast: Secondary | ICD-10-CM

## 2014-01-07 ENCOUNTER — Encounter: Payer: Self-pay | Admitting: Cardiology

## 2014-01-08 ENCOUNTER — Ambulatory Visit (INDEPENDENT_AMBULATORY_CARE_PROVIDER_SITE_OTHER): Payer: Medicare Other | Admitting: Cardiology

## 2014-01-08 ENCOUNTER — Encounter: Payer: Self-pay | Admitting: Cardiology

## 2014-01-08 VITALS — BP 162/66 | HR 66 | Ht 63.0 in | Wt 153.0 lb

## 2014-01-08 DIAGNOSIS — R002 Palpitations: Secondary | ICD-10-CM

## 2014-01-08 NOTE — Patient Instructions (Signed)
Your physician recommends that you schedule a follow-up appointment in: one year Dr. Antoine PocheHochrein

## 2014-01-08 NOTE — Progress Notes (Signed)
HPI The patient has had a history of chest pain and palpitations. In 2012 she had a negative stress perfusion study with a well preserved ejection fraction. Last year she had palpitations and wore an event monitor but there were no significant dysrhythmias seen.   Unfortunately she has not tolerated 25 mg twice a day and metoprolol. She said that this made her feel "like a zombie".   She continues to get palpitations. We have does suggest that her evening dose of Cardizem. For a while she was a little bit better. She still has palpitations and she hasn't at night she can't sleep and she is tired the next day. If she hasn't during the day she's not having any presyncope or syncope. He's not having any chest pressure, neck or arm discomfort. She's not having any shortness of breath that is new, PND or orthopnea. Of note she did have her thyroid checked recently and it was normal. She has not had a followup appointment with her primary provider to discuss anything to help with sleep or anxiety particularly at night.   Allergies  Allergen Reactions  . Codeine Nausea Only  . Erythromycin Nausea Only  . Metronidazole Nausea Only  . Pseudoephedrine Other (See Comments)    Skin crawling  . Sertraline Hcl Other (See Comments)    Increased depression  . Venlafaxine Other (See Comments)    Increased depression    Current Outpatient Prescriptions  Medication Sig Dispense Refill  . aspirin 81 MG tablet Take 81 mg by mouth daily.        . Calcium Carbonate-Vitamin D (CALCIUM 600 + D PO) Take by mouth daily.      . clorazepate (TRANXENE) 3.75 MG tablet Take 3.75 mg by mouth daily.        Marland Kitchen. diltiazem (CARDIZEM) 30 MG tablet Take 1 tablet (30 mg total) by mouth 4 (four) times daily.  120 tablet  6  . dorzolamide-timolol (COSOPT) 22.3-6.8 MG/ML ophthalmic solution Place 1 drop into both eyes 2 (two) times daily.      Marland Kitchen. estradiol (ESTRACE) 0.1 MG/GM vaginal cream Place 1 Applicatorful vaginally as needed.       . fexofenadine (ALLEGRA) 180 MG tablet Take 180 mg by mouth daily as needed for allergies or rhinitis.      Marland Kitchen. meclizine (ANTIVERT) 25 MG tablet Take 1 tablet (25 mg total) by mouth 3 (three) times daily as needed for dizziness.  15 tablet  0  . Multiple Vitamin (MULTIVITAMIN) tablet Take 1 tablet by mouth daily.       . naproxen sodium (ANAPROX) 220 MG tablet Take 220 mg by mouth 2 (two) times daily as needed (for pain).      . Omega-3 Fatty Acids (FISH OIL) 1000 MG CAPS Take 1 capsule by mouth daily. Takes 1/2 tab      . pantoprazole (PROTONIX) 40 MG tablet Take 40 mg by mouth daily.        . rosuvastatin (CRESTOR) 5 MG tablet Take 5 mg by mouth daily.        Marland Kitchen. triamcinolone (NASACORT ALLERGY 24HR) 55 MCG/ACT AERO nasal inhaler As directed       No current facility-administered medications for this visit.    Past Medical History  Diagnosis Date  . Hyperlipidemia     mixed  . SVT (supraventricular tachycardia)   . PSVT (paroxysmal supraventricular tachycardia)   . PAT (paroxysmal atrial tachycardia)   . GERD (gastroesophageal reflux disease)     Past  Surgical History  Procedure Laterality Date  . Breast surgery      biopsy  . Cardioversion    . Cardiac electrophysiology mapping and ablation    . Abdominal hysterectomy    . Appendectomy    . Tonsillectomy    . Cholecystectomy      ROS:   As stated in the HPI and negative for all other systems.   PHYSICAL EXAM BP 162/66  Pulse 66  Ht 5\' 3"  (1.6 m)  Wt 153 lb (69.4 kg)  BMI 27.11 kg/m2 GENERAL:  Well appearing HEENT:  Pupils equal round and reactive, fundi not visualized, oral mucosa unremarkable NECK:  No jugular venous distention, waveform within normal limits, carotid upstroke brisk and symmetric, left soft bruit, no thyromegaly LUNGS:  Clear to auscultation bilaterally CHEST:  Unremarkable HEART:  PMI not displaced or sustained,S1 and S2 within normal limits, no S3, no S4, no clicks, no rubs, no  murmurs ABD:   Flat, positive bowel sounds normal in frequency in pitch, no bruits, no rebound, no guarding, no midline pulsatile mass, no hepatomegaly, no splenomegaly EXT:  2 plus pulses throughout, no edema, no cyanosis no clubbing, right femoral bruit  EKG:  Sinus rhythm, rate 66, axis within normal limits, intervals within normal limits, no acute ST-T wave changes.  01/08/2014  ASSESSMENT AND PLAN  PALPITATIONS:  We again talked about when necessary dosing of the Cardizem and she will consider taking an an extra dose if she's having a particularly if she is having had episodes. At this point she does want to try another calcium channel blocker or beta blocker although we will consider this.  HTN:  BP is mildly elevated.   No change in therapy is indicated as she reports that it is usually well controlled at home.  BRUIT:  She had no stenosis on Doppler. No change in therapy is indicated.

## 2014-01-11 ENCOUNTER — Other Ambulatory Visit: Payer: Self-pay

## 2014-01-16 ENCOUNTER — Encounter: Payer: Self-pay | Admitting: Cardiology

## 2014-01-17 ENCOUNTER — Ambulatory Visit
Admission: RE | Admit: 2014-01-17 | Discharge: 2014-01-17 | Disposition: A | Discharge status: Home / Self Care | Payer: 59 | Source: Ambulatory Visit

## 2014-01-17 DIAGNOSIS — Z1231 Encounter for screening mammogram for malignant neoplasm of breast: Secondary | ICD-10-CM

## 2014-04-22 ENCOUNTER — Telehealth: Payer: Self-pay | Admitting: Cardiology

## 2014-04-22 MED ORDER — DILTIAZEM HCL 30 MG PO TABS: 30.0000 mg | ORAL_TABLET | Freq: Four times a day (QID) | ORAL | Status: DC

## 2014-04-22 NOTE — Telephone Encounter (Signed)
Pt called in requesting that Dr. Antoine PocheHochrein increase her number of tabs on her Diltiazem 30mg  and [facility]. Please call  thanks

## 2014-04-22 NOTE — Telephone Encounter (Signed)
Pt verified that the QID for diltiazem 30mg  tabs is working for her in combination w/ her other medications and she would like to be supplied for refill. Refill submitted to patient's preferred pharmacy. Informed patient. Pt voiced understanding, no other stated concerns at this time.

## 2014-04-25 ENCOUNTER — Other Ambulatory Visit: Payer: Self-pay | Admitting: Cardiology

## 2014-09-23 ENCOUNTER — Other Ambulatory Visit: Payer: Self-pay

## 2014-11-21 ENCOUNTER — Ambulatory Visit
Admission: RE | Admit: 2014-11-21 | Discharge: 2014-11-21 | Disposition: A | Discharge status: Home / Self Care | Payer: Medicare Other | Source: Ambulatory Visit | Attending: Otolaryngology | Admitting: Otolaryngology

## 2014-11-21 ENCOUNTER — Other Ambulatory Visit: Payer: Self-pay | Admitting: Otolaryngology

## 2014-11-21 DIAGNOSIS — J320 Chronic maxillary sinusitis: Secondary | ICD-10-CM

## 2014-11-28 ENCOUNTER — Other Ambulatory Visit: Payer: Self-pay

## 2014-11-28 DIAGNOSIS — Z1231 Encounter for screening mammogram for malignant neoplasm of breast: Secondary | ICD-10-CM

## 2015-01-13 ENCOUNTER — Ambulatory Visit (INDEPENDENT_AMBULATORY_CARE_PROVIDER_SITE_OTHER): Payer: Medicare Other | Admitting: Cardiology

## 2015-01-13 ENCOUNTER — Encounter: Payer: Self-pay | Admitting: Cardiology

## 2015-01-13 VITALS — BP 130/70 | HR 65 | Ht 63.0 in | Wt 152.6 lb

## 2015-01-13 DIAGNOSIS — I471 Supraventricular tachycardia, unspecified: Secondary | ICD-10-CM

## 2015-01-13 DIAGNOSIS — R002 Palpitations: Secondary | ICD-10-CM | POA: Diagnosis not present

## 2015-01-13 MED ORDER — DILTIAZEM HCL 30 MG PO TABS: 30.0000 mg | ORAL_TABLET | Freq: Four times a day (QID) | ORAL | Status: DC

## 2015-01-13 NOTE — Progress Notes (Signed)
HPI The patient has had a history of chest pain and palpitations. In 2012 she had a negative stress perfusion study with a well preserved ejection fraction. She has had significant palpitations but has not tolerated beta blockers. However, she's doing well on the current dose of Cardizem. She rarely feels palpitations. Her blood pressure is controlled. She's not had any presyncope or syncope. She is active in her flowers She denies any chest pressure, neck or arm discomfort.   Allergies  Allergen Reactions  . Codeine Nausea Only  . Erythromycin Nausea Only  . Metronidazole Nausea Only  . Pseudoephedrine Other (See Comments)    Skin crawling  . Sertraline Hcl Other (See Comments)    Increased depression  . Venlafaxine Other (See Comments)    Increased depression    Current Outpatient Prescriptions  Medication Sig Dispense Refill  . aspirin 81 MG tablet Take 81 mg by mouth daily.      . Calcium Carbonate-Vitamin D (CALCIUM 600 + D PO) Take by mouth daily.    . cholecalciferol (VITAMIN D) 400 UNITS TABS tablet Take 400 Units by mouth daily.    Marland Kitchen. diltiazem (CARDIZEM) 30 MG tablet Take 1 tablet (30 mg total) by mouth 4 (four) times daily. 360 tablet 2  . dorzolamide-timolol (COSOPT) 22.3-6.8 MG/ML ophthalmic solution Place 1 drop into both eyes 2 (two) times daily.    Marland Kitchen. escitalopram (LEXAPRO) 10 MG tablet Take 10 mg by mouth daily.    Marland Kitchen. estradiol (ESTRACE) 0.1 MG/GM vaginal cream Place 1 Applicatorful vaginally as needed.    . fexofenadine (ALLEGRA) 180 MG tablet Take 180 mg by mouth daily as needed for allergies or rhinitis.    Marland Kitchen. LORazepam (ATIVAN) 0.5 MG tablet Take 0.5 mg by mouth 2 (two) times daily.    . meclizine (ANTIVERT) 25 MG tablet Take 1 tablet (25 mg total) by mouth 3 (three) times daily as needed for dizziness. 15 tablet 0  . Multiple Vitamin (MULTIVITAMIN) tablet Take 1 tablet by mouth daily.     . naproxen sodium (ANAPROX) 220 MG tablet Take 220 mg by mouth 2 (two) times  daily as needed (for pain).    . Omega-3 Fatty Acids (FISH OIL) 1000 MG CAPS Take 1 capsule by mouth daily. Takes 1/2 tab    . pantoprazole (PROTONIX) 40 MG tablet Take 40 mg by mouth daily.      Bertram Gala. Polyethyl Glycol-Propyl Glycol (SYSTANE) 0.4-0.3 % GEL ophthalmic gel Place 1 application into both eyes 2 (two) times daily.    . rosuvastatin (CRESTOR) 5 MG tablet Take 5 mg by mouth daily.      Marland Kitchen. triamcinolone (NASACORT ALLERGY 24HR) 55 MCG/ACT AERO nasal inhaler Place 1 spray into the nose daily. As directed     No current facility-administered medications for this visit.    Past Medical History  Diagnosis Date  . Hyperlipidemia     mixed  . SVT (supraventricular tachycardia) (HCC)   . PSVT (paroxysmal supraventricular tachycardia) (HCC)   . PAT (paroxysmal atrial tachycardia) (HCC)   . GERD (gastroesophageal reflux disease)     Past Surgical History  Procedure Laterality Date  . Breast surgery      biopsy  . Cardioversion    . Cardiac electrophysiology mapping and ablation    . Abdominal hysterectomy    . Appendectomy    . Tonsillectomy    . Cholecystectomy      ROS:   As stated in the HPI and negative for all other systems.  PHYSICAL EXAM BP 130/70 mmHg  Pulse 65  Ht  (1.6 m)  Wt 152 lb 9.6 oz (69.219 kg)  BMI 27.04 kg/m2 GENERAL:  Well appearing NECK:  No jugular venous distention, waveform within normal limits, carotid upstroke brisk and symmetric, left soft bruit, no thyromegaly LUNGS:  Clear to auscultation bilaterally CHEST:  Unremarkable HEART:  PMI not displaced or sustained,S1 and S2 within normal limits, no S3, no S4, no clicks, no rubs, no  murmurs ABD:  Flat, positive bowel sounds normal in frequency in pitch, no bruits, no rebound, no guarding, no midline pulsatile mass, no hepatomegaly, no splenomegaly EXT:  2 plus pulses throughout, no edema, no cyanosis no clubbing, right femoral bruit  EKG:  Sinus rhythm, rate 65, axis within normal limits,  intervals within normal limits, no acute ST-T wave changes.  01/13/2015  ASSESSMENT AND PLAN  PALPITATIONS:  She is doing well current regimen. No change in therapy is indicated.  HTN:  BP is controlled.  She will remain on the meds as listed.

## 2015-01-13 NOTE — Patient Instructions (Signed)
Your physician wants you to follow-up in: 1 Year. You will receive a reminder letter in the mail two months in advance. If you don't receive a letter, please call our office to schedule the follow-up appointment.  

## 2015-01-20 ENCOUNTER — Ambulatory Visit
Admission: RE | Admit: 2015-01-20 | Discharge: 2015-01-20 | Disposition: A | Discharge status: Home / Self Care | Payer: Medicare Other | Source: Ambulatory Visit

## 2015-01-20 ENCOUNTER — Other Ambulatory Visit: Payer: Self-pay | Admitting: Family Medicine

## 2015-01-20 ENCOUNTER — Ambulatory Visit
Admission: RE | Admit: 2015-01-20 | Discharge: 2015-01-20 | Disposition: A | Discharge status: Home / Self Care | Payer: Medicare Other | Source: Ambulatory Visit | Attending: Family Medicine | Admitting: Family Medicine

## 2015-01-20 DIAGNOSIS — Z1231 Encounter for screening mammogram for malignant neoplasm of breast: Secondary | ICD-10-CM

## 2015-01-20 DIAGNOSIS — R109 Unspecified abdominal pain: Secondary | ICD-10-CM

## 2015-01-20 DIAGNOSIS — R110 Nausea: Secondary | ICD-10-CM

## 2015-01-23 ENCOUNTER — Other Ambulatory Visit: Payer: Self-pay | Admitting: Gastroenterology

## 2015-01-23 DIAGNOSIS — R1084 Generalized abdominal pain: Secondary | ICD-10-CM

## 2015-01-27 ENCOUNTER — Ambulatory Visit
Admission: RE | Admit: 2015-01-27 | Discharge: 2015-01-27 | Disposition: A | Discharge status: Home / Self Care | Payer: Medicare Other | Source: Ambulatory Visit | Attending: Gastroenterology | Admitting: Gastroenterology

## 2015-01-27 DIAGNOSIS — R1084 Generalized abdominal pain: Secondary | ICD-10-CM

## 2015-01-27 MED ORDER — IOPAMIDOL (ISOVUE-300) INJECTION 61%
100.0000 mL | Freq: Once | INTRAVENOUS | Status: AC | PRN
  Administered 2015-01-27: 100 mL via INTRAVENOUS

## 2015-07-14 ENCOUNTER — Other Ambulatory Visit: Payer: Self-pay | Admitting: Cardiology

## 2015-07-14 DIAGNOSIS — I6523 Occlusion and stenosis of bilateral carotid arteries: Secondary | ICD-10-CM

## 2015-07-21 ENCOUNTER — Ambulatory Visit (HOSPITAL_COMMUNITY)
Admission: RE | Admit: 2015-07-21 | Discharge: 2015-07-21 | Disposition: A | Discharge status: Home / Self Care | Payer: Medicare Other | Source: Ambulatory Visit | Attending: Cardiovascular Disease | Admitting: Cardiovascular Disease

## 2015-07-21 DIAGNOSIS — E785 Hyperlipidemia, unspecified: Secondary | ICD-10-CM | POA: Insufficient documentation

## 2015-07-21 DIAGNOSIS — I6523 Occlusion and stenosis of bilateral carotid arteries: Secondary | ICD-10-CM | POA: Insufficient documentation

## 2015-07-21 DIAGNOSIS — K219 Gastro-esophageal reflux disease without esophagitis: Secondary | ICD-10-CM | POA: Insufficient documentation

## 2015-11-10 ENCOUNTER — Other Ambulatory Visit: Payer: Self-pay | Admitting: Cardiology

## 2016-01-01 ENCOUNTER — Other Ambulatory Visit: Payer: Self-pay | Admitting: Family Medicine

## 2016-01-01 DIAGNOSIS — Z1231 Encounter for screening mammogram for malignant neoplasm of breast: Secondary | ICD-10-CM

## 2016-01-12 NOTE — Progress Notes (Signed)
HPI The patient has had a history of chest pain and palpitations. In 2012 she had a negative stress perfusion study with a well preserved ejection fraction. She has had significant palpitations but has not tolerated beta blockers.   However, she's doing well on the current dose of Cardizem.  The patient denies any new symptoms such as chest discomfort, neck or arm discomfort. There has been no new shortness of breath, PND or orthopnea. There have been no reported palpitations, presyncope or syncope.  Allergies  Allergen Reactions  . Codeine Nausea Only  . Erythromycin Nausea Only  . Metronidazole Nausea Only  . Pseudoephedrine Other (See Comments)    Skin crawling  . Sertraline Hcl Other (See Comments)    Increased depression  . Venlafaxine Other (See Comments)    Increased depression    Current Outpatient Prescriptions  Medication Sig Dispense Refill  . acetaminophen (TYLENOL) 650 MG CR tablet Take 650 mg by mouth every 8 (eight) hours as needed for pain.    Marland Kitchen aspirin 81 MG tablet Take 81 mg by mouth daily.      . Calcium Carbonate-Vitamin D (CALCIUM 600 + D PO) Take by mouth daily.    . cholecalciferol (VITAMIN D) 400 UNITS TABS tablet Take 400 Units by mouth daily.    Marland Kitchen diltiazem (CARDIZEM) 30 MG tablet Take 1 tablet by mouth 4  times daily 360 tablet 1  . dorzolamide-timolol (COSOPT) 22.3-6.8 MG/ML ophthalmic solution Place 1 drop into both eyes 2 (two) times daily.    Marland Kitchen escitalopram (LEXAPRO) 10 MG tablet Take 10 mg by mouth daily.    . fexofenadine (ALLEGRA) 180 MG tablet Take 180 mg by mouth daily as needed for allergies or rhinitis.    Marland Kitchen LORazepam (ATIVAN) 0.5 MG tablet Take 0.5 mg by mouth 2 (two) times daily.    . Multiple Vitamin (MULTIVITAMIN) tablet Take 1 tablet by mouth daily.     . Omega-3 Fatty Acids (FISH OIL) 1000 MG CAPS Take 1 capsule by mouth daily. Takes 1/2 tab    . pantoprazole (PROTONIX) 40 MG tablet Take 40 mg by mouth daily.      Bertram Gala Glycol-Propyl  Glycol (SYSTANE) 0.4-0.3 % GEL ophthalmic gel Place 1 application into both eyes 2 (two) times daily.    . rosuvastatin (CRESTOR) 5 MG tablet Take 5 mg by mouth daily.      Marland Kitchen triamcinolone (NASACORT ALLERGY 24HR) 55 MCG/ACT AERO nasal inhaler Place 1 spray into the nose daily. As directed     No current facility-administered medications for this visit.     Past Medical History:  Diagnosis Date  . GERD (gastroesophageal reflux disease)   . Hyperlipidemia    mixed  . PAT (paroxysmal atrial tachycardia) (HCC)   . PSVT (paroxysmal supraventricular tachycardia) (HCC)     Past Surgical History:  Procedure Laterality Date  . ABDOMINAL HYSTERECTOMY    . APPENDECTOMY    . BREAST SURGERY     biopsy  . CARDIAC ELECTROPHYSIOLOGY MAPPING AND ABLATION    . CARDIOVERSION    . CATARACT EXTRACTION Bilateral   . CHOLECYSTECTOMY    . TONSILLECTOMY      ROS:    As stated in the HPI and negative for all other systems.   PHYSICAL EXAM BP 122/66 (BP Location: Left Arm, Patient Position: Sitting, Cuff Size: Normal)   Pulse 60   Ht 5\' 3"  (1.6 m)   Wt 155 lb (70.3 kg)   BMI 27.46 kg/m  GENERAL:  Well appearing NECK:  No jugular venous distention, waveform within normal limits, carotid upstroke brisk and symmetric, left soft bruit, no thyromegaly LUNGS:  Clear to auscultation bilaterally CHEST:  Unremarkable HEART:  PMI not displaced or sustained,S1 and S2 within normal limits, no S3, no S4, no clicks, no rubs, no  murmurs ABD:  Flat, positive bowel sounds normal in frequency in pitch, no bruits, no rebound, no guarding, no midline pulsatile mass, no hepatomegaly, no splenomegaly EXT:  2 plus pulses throughout, no edema, no cyanosis no clubbing, right femoral bruit  EKG:  Sinus rhythm, rate 61, axis within normal limits, intervals within normal limits, no acute ST-T wave changes.  01/14/2016  ASSESSMENT AND PLAN  PALPITATIONS:  She is doing well current regimen. No change in therapy is  indicated.  HTN:  BP is controlled.  She will remain on the meds as listed.  CAROTID STENOSIS:  She had mild plaque on carotid this April.  No follow up is indicated.

## 2016-01-13 ENCOUNTER — Encounter: Payer: Self-pay | Admitting: Cardiology

## 2016-01-13 ENCOUNTER — Ambulatory Visit (INDEPENDENT_AMBULATORY_CARE_PROVIDER_SITE_OTHER): Payer: Medicare Other | Admitting: Cardiology

## 2016-01-13 VITALS — BP 122/66 | HR 60 | Ht 63.0 in | Wt 155.0 lb

## 2016-01-13 DIAGNOSIS — R002 Palpitations: Secondary | ICD-10-CM

## 2016-01-13 NOTE — Patient Instructions (Signed)
Medication Instructions:  Your physician recommends that you continue on your current medications as directed. Please refer to the Current Medication list given to you today.  Labwork: None   Testing/Procedures: None   Follow-Up: Your physician wants you to follow-up in: 12 months with Dr Hochrein. You will receive a reminder letter in the mail two months in advance. If you don't receive a letter, please call our office to schedule the follow-up appointment.  Any Other Special Instructions Will Be Listed Below (If Applicable).     If you need a refill on your cardiac medications before your next appointment, please call your pharmacy.  

## 2016-01-14 ENCOUNTER — Encounter: Payer: Self-pay | Admitting: Cardiology

## 2016-01-21 ENCOUNTER — Ambulatory Visit
Admission: RE | Admit: 2016-01-21 | Discharge: 2016-01-21 | Disposition: A | Discharge status: Home / Self Care | Payer: Medicare Other | Source: Ambulatory Visit | Attending: Family Medicine | Admitting: Family Medicine

## 2016-01-21 DIAGNOSIS — Z1231 Encounter for screening mammogram for malignant neoplasm of breast: Secondary | ICD-10-CM

## 2016-04-11 ENCOUNTER — Other Ambulatory Visit: Payer: Self-pay | Admitting: Cardiology

## 2016-04-12 NOTE — Telephone Encounter (Signed)
Rx(s) sent to pharmacy electronically.  

## 2016-06-29 ENCOUNTER — Ambulatory Visit: Payer: Self-pay | Admitting: Podiatry

## 2016-07-07 ENCOUNTER — Ambulatory Visit (INDEPENDENT_AMBULATORY_CARE_PROVIDER_SITE_OTHER): Payer: Medicare Other | Admitting: Podiatry

## 2016-07-07 ENCOUNTER — Encounter: Payer: Self-pay | Admitting: Podiatry

## 2016-07-07 VITALS — BP 153/58 | HR 63 | Ht 63.0 in | Wt 150.0 lb

## 2016-07-07 DIAGNOSIS — M79671 Pain in right foot: Secondary | ICD-10-CM

## 2016-07-07 DIAGNOSIS — M79672 Pain in left foot: Secondary | ICD-10-CM | POA: Diagnosis not present

## 2016-07-07 DIAGNOSIS — M2010 Hallux valgus (acquired), unspecified foot: Secondary | ICD-10-CM

## 2016-07-07 DIAGNOSIS — M2041 Other hammer toe(s) (acquired), right foot: Secondary | ICD-10-CM

## 2016-07-07 DIAGNOSIS — M2042 Other hammer toe(s) (acquired), left foot: Secondary | ICD-10-CM

## 2016-07-07 NOTE — Patient Instructions (Signed)
Seen for pressure in between the first and 2nd toe. Noted that the great toe has been turning towards 2nd toe. Toe spreader may help while wearing closed in shoes. Gel toe spreader dispensed x 2. Return as needed.

## 2016-07-07 NOTE — Progress Notes (Signed)
SUBJECTIVE: 83 y.o. year old female presents complaining of abnormal toe pressure. A month ago she noted a blister in between the first and 2nd toe right foot worse than the left. Felt like growing a knuckle.  Positive history of past MIS bunion surgery over 30 years ago by Dr. Gayleen Orem.  REVIEW OF SYSTEMS: A comprehensive review of systems was negative except for: Occasional back problem and heart arrythmia that is under management.  OBJECTIVE: DERMATOLOGIC EXAMINATION: No abnormal lesions found.  VASCULAR EXAMINATION OF LOWER LIMBS: All pedal pulses are palpable with normal pulsation.  Capillary Filling times within 3 seconds in all digits.  No edema or erythema noted. Temperature gradient from tibial crest to dorsum of foot is within normal bilateral.  NEUROLOGIC EXAMINATION OF THE LOWER LIMBS: All epicritic and tactile sensations grossly intact. Sharp and Dull discriminatory sensations at the plantar ball of hallux is intact bilateral.   MUSCULOSKELETAL EXAMINATION: Positive for hallux valgus with medially bulging 2nd toe bilateral.   ASSESSMENT: Hallux valgus bilateral. Hammer toe 2 bilateral. Pain in foot bilateral.  PLAN: Reviewed clinical findings and available treatment options. Gel toe spreader dispensed to wear while in closed in shoes. Return as needed.

## 2016-08-31 ENCOUNTER — Encounter: Payer: Self-pay | Admitting: Cardiology

## 2016-08-31 ENCOUNTER — Other Ambulatory Visit: Payer: Self-pay | Admitting: *Deleted

## 2016-08-31 MED ORDER — DILTIAZEM HCL 30 MG PO TABS: ORAL_TABLET | ORAL | 2 refills | Status: DC

## 2016-12-06 ENCOUNTER — Other Ambulatory Visit (HOSPITAL_COMMUNITY): Payer: Self-pay | Admitting: Family Medicine

## 2016-12-06 ENCOUNTER — Ambulatory Visit (HOSPITAL_COMMUNITY)
Admission: RE | Admit: 2016-12-06 | Discharge: 2016-12-06 | Disposition: A | Discharge status: Home / Self Care | Payer: Medicare Other | Source: Ambulatory Visit | Attending: Surgery | Admitting: Surgery

## 2016-12-06 DIAGNOSIS — M79604 Pain in right leg: Secondary | ICD-10-CM

## 2016-12-07 ENCOUNTER — Encounter: Payer: Self-pay | Admitting: Family Medicine

## 2016-12-20 ENCOUNTER — Other Ambulatory Visit: Payer: Self-pay | Admitting: Family Medicine

## 2016-12-20 DIAGNOSIS — Z1231 Encounter for screening mammogram for malignant neoplasm of breast: Secondary | ICD-10-CM

## 2017-01-24 ENCOUNTER — Ambulatory Visit: Payer: Medicare Other

## 2017-01-25 ENCOUNTER — Ambulatory Visit
Admission: RE | Admit: 2017-01-25 | Discharge: 2017-01-25 | Disposition: A | Discharge status: Home / Self Care | Payer: Medicare Other | Source: Ambulatory Visit | Attending: Family Medicine | Admitting: Family Medicine

## 2017-01-25 DIAGNOSIS — Z1231 Encounter for screening mammogram for malignant neoplasm of breast: Secondary | ICD-10-CM

## 2017-01-26 ENCOUNTER — Other Ambulatory Visit: Payer: Self-pay | Admitting: Cardiology

## 2017-01-26 ENCOUNTER — Other Ambulatory Visit: Payer: Self-pay | Admitting: Family Medicine

## 2017-01-26 DIAGNOSIS — R928 Other abnormal and inconclusive findings on diagnostic imaging of breast: Secondary | ICD-10-CM

## 2017-02-01 ENCOUNTER — Ambulatory Visit
Admission: RE | Admit: 2017-02-01 | Discharge: 2017-02-01 | Disposition: A | Discharge status: Home / Self Care | Payer: Medicare Other | Source: Ambulatory Visit | Attending: Family Medicine | Admitting: Family Medicine

## 2017-02-01 ENCOUNTER — Ambulatory Visit: Payer: Medicare Other

## 2017-02-01 DIAGNOSIS — R928 Other abnormal and inconclusive findings on diagnostic imaging of breast: Secondary | ICD-10-CM

## 2017-02-14 DIAGNOSIS — E785 Hyperlipidemia, unspecified: Secondary | ICD-10-CM | POA: Insufficient documentation

## 2017-02-14 DIAGNOSIS — I4719 Other supraventricular tachycardia: Secondary | ICD-10-CM | POA: Insufficient documentation

## 2017-02-14 DIAGNOSIS — K219 Gastro-esophageal reflux disease without esophagitis: Secondary | ICD-10-CM | POA: Insufficient documentation

## 2017-02-14 DIAGNOSIS — I471 Supraventricular tachycardia: Secondary | ICD-10-CM | POA: Insufficient documentation

## 2017-02-24 NOTE — Progress Notes (Signed)
Cardiology Office Note   Date:  02/25/2017   ID:  [name], DOB [birth year], MRN [id number]  PCP:  Merri BrunetteSmith, Candace, MD  Cardiologist:   Rollene RotundaJames Smith Potenza, MD    Chief Complaint  Patient presents with  . Palpitations      History of Present Illness: [name] is a 83 y.o. female who presents for follow up of chest pain and palpitations.    In 2012 she had a negative stress perfusion study with a well preserved ejection fraction. She has had significant palpitations but has not tolerated beta blockers.   However, she did well on the current dose of Cardizem.  She feels great.  She has no further palpitations.  She is very active.  The patient denies any new symptoms such as chest discomfort, neck or arm discomfort. There has been no new shortness of breath, PND or orthopnea. There have been no reported palpitations, presyncope or syncope.   Past Medical History:  Diagnosis Date  . GERD (gastroesophageal reflux disease)   . Hyperlipidemia    mixed  . PAT (paroxysmal atrial tachycardia) (HCC)   . PSVT (paroxysmal supraventricular tachycardia) (HCC)     Past Surgical History:  Procedure Laterality Date  . ABDOMINAL HYSTERECTOMY    . APPENDECTOMY    . BREAST SURGERY     biopsy  . CARDIAC ELECTROPHYSIOLOGY MAPPING AND ABLATION    . CARDIOVERSION    . CATARACT EXTRACTION Bilateral   . CHOLECYSTECTOMY    . TONSILLECTOMY       Current Outpatient Medications  Medication Sig Dispense Refill  . acetaminophen (TYLENOL) 650 MG CR tablet Take 650 mg by mouth every 8 (eight) hours as needed for pain.    Marland Kitchen. aspirin 81 MG tablet Take 81 mg by mouth daily.      . Calcium Carbonate-Vitamin D (CALCIUM 600 + D PO) Take by mouth daily.    . Cholecalciferol 1000 units capsule Take 1,000 Units by mouth daily.     Marland Kitchen. diltiazem (CARDIZEM) 30 MG tablet TAKE 1 TABLET BY MOUTH 4  TIMES DAILY 360 tablet 2  . dorzolamide-timolol (COSOPT) 22.3-6.8 MG/ML ophthalmic solution Place 1 drop  into both eyes 2 (two) times daily.    Marland Kitchen. escitalopram (LEXAPRO) 10 MG tablet Take 10 mg by mouth daily.    . fexofenadine (ALLEGRA) 180 MG tablet Take 180 mg by mouth daily as needed for allergies or rhinitis.    Marland Kitchen. LORazepam (ATIVAN) 0.5 MG tablet Take 0.5 mg by mouth 2 (two) times daily.    . Multiple Vitamin (MULTIVITAMIN) tablet Take 1 tablet by mouth daily.     . Omega-3 Fatty Acids (FISH OIL) 1000 MG CAPS Take 1 capsule by mouth daily.     . pantoprazole (PROTONIX) 40 MG tablet Take 40 mg by mouth daily.      Bertram Gala. Polyethyl Glycol-Propyl Glycol (SYSTANE) 0.4-0.3 % GEL ophthalmic gel Place 1 application into both eyes 2 (two) times daily.    . rosuvastatin (CRESTOR) 5 MG tablet Take 5 mg by mouth daily.      Marland Kitchen. triamcinolone (NASACORT ALLERGY 24HR) 55 MCG/ACT AERO nasal inhaler Place 1 spray into the nose daily. As directed     No current facility-administered medications for this visit.     Allergies:   Codeine; Erythromycin; Metronidazole; Pseudoephedrine; Sertraline hcl; and Venlafaxine    ROS:  Please see the history of present illness.   Otherwise, review of systems are positive for none.   All  other systems are reviewed and negative.    PHYSICAL EXAM: VS:  BP 130/62   Pulse 64   Ht 5\' 3"  (1.6 m)   Wt 153 lb 9.6 oz (69.7 kg)   BMI 27.21 kg/m  , BMI Body mass index is 27.21 kg/m.  GENERAL:  Well appearing NECK:  No jugular venous distention, waveform within normal limits, carotid upstroke brisk and symmetric, no bruits, no thyromegaly LUNGS:  Clear to auscultation bilaterally CHEST:  Unremarkable HEART:  PMI not displaced or sustained,S1 and S2 within normal limits, no S3, no S4, no clicks, no rubs, no murmurs ABD:  Flat, positive bowel sounds normal in frequency in pitch, no bruits, no rebound, no guarding, no midline pulsatile mass, no hepatomegaly, no splenomegaly EXT:  2 plus pulses throughout, no edema, no cyanosis no clubbing    EKG:  EKG is not ordered today. The ekg  ordered today demonstrates Sinus rhythm, rate 64, axis within normal limits, intervals within normal limits, no acute ST-T wave changes.   Recent Labs: No results found for requested labs within last 8760 hours.    Lipid Panel No results found for: CHOL, TRIG, HDL, CHOLHDL, VLDL, LDLCALC, LDLDIRECT    Wt Readings from Last 3 Encounters:  02/25/17 153 lb 9.6 oz (69.7 kg)  07/07/16 150 lb (68 kg)  01/13/16 155 lb (70.3 kg)      Other studies Reviewed: Additional studies/ records that were reviewed today include: None.. Review of the above records demonstrates:  Please see elsewhere in the note.     ASSESSMENT AND PLAN:    PALPITATIONS:   She has no new symptoms.  She is tolerating the current meds.  No change in therapy.    HTN:  The blood pressure is at target. No change in medications is indicated. We will continue with therapeutic lifestyle changes (TLC).  CAROTID STENOSIS:  No further imaging is indicated.  This was very mild in the past.    Current medicines are reviewed at length with the patient today.  The patient does not have concerns regarding medicines.  The following changes have been made:  no change  Labs/ tests ordered today include:   Orders Placed This Encounter  Procedures  . EKG 12-Lead     Disposition:   FU with me as needed.      Signed, Rollene RotundaJames Rogelio Winbush, MD  02/25/2017 1:52 PM     [facility]

## 2017-02-25 ENCOUNTER — Ambulatory Visit: Payer: Medicare Other | Admitting: Cardiology

## 2017-02-25 ENCOUNTER — Encounter: Payer: Self-pay | Admitting: Cardiology

## 2017-02-25 VITALS — BP 130/62 | HR 64 | Ht 63.0 in | Wt 153.6 lb

## 2017-02-25 DIAGNOSIS — I10 Essential (primary) hypertension: Secondary | ICD-10-CM

## 2017-02-25 DIAGNOSIS — I471 Supraventricular tachycardia: Secondary | ICD-10-CM

## 2017-02-25 NOTE — Patient Instructions (Signed)
Medication Instructions:  Continue current medications  If you need a refill on your cardiac medications before your next appointment, please call your pharmacy.  Labwork: None Ordered  Testing/Procedures: None Ordered  Follow-Up: Your physician wants you to follow-up in: As Needed.      Thank you for choosing [facility]!!       

## 2017-12-30 ENCOUNTER — Other Ambulatory Visit: Payer: Self-pay | Admitting: Family Medicine

## 2017-12-30 DIAGNOSIS — Z1231 Encounter for screening mammogram for malignant neoplasm of breast: Secondary | ICD-10-CM

## 2018-02-02 ENCOUNTER — Ambulatory Visit
Admission: RE | Admit: 2018-02-02 | Discharge: 2018-02-02 | Disposition: A | Discharge status: Home / Self Care | Payer: Medicare Other | Source: Ambulatory Visit | Attending: Family Medicine | Admitting: Family Medicine

## 2018-02-02 DIAGNOSIS — Z1231 Encounter for screening mammogram for malignant neoplasm of breast: Secondary | ICD-10-CM

## 2019-01-01 ENCOUNTER — Other Ambulatory Visit: Payer: Self-pay | Admitting: Family Medicine

## 2019-01-01 DIAGNOSIS — Z1231 Encounter for screening mammogram for malignant neoplasm of breast: Secondary | ICD-10-CM

## 2019-01-11 ENCOUNTER — Other Ambulatory Visit: Payer: Self-pay

## 2019-01-11 DIAGNOSIS — Z20822 Contact with and (suspected) exposure to covid-19: Secondary | ICD-10-CM

## 2019-01-12 LAB — NOVEL CORONAVIRUS, NAA: SARS-CoV-2, NAA: NOT DETECTED

## 2019-02-14 ENCOUNTER — Ambulatory Visit
Admission: RE | Admit: 2019-02-14 | Discharge: 2019-02-14 | Disposition: A | Discharge status: Home / Self Care | Payer: Medicare Other | Source: Ambulatory Visit | Attending: Family Medicine | Admitting: Family Medicine

## 2019-02-14 ENCOUNTER — Other Ambulatory Visit: Payer: Self-pay

## 2019-02-14 DIAGNOSIS — Z1231 Encounter for screening mammogram for malignant neoplasm of breast: Secondary | ICD-10-CM

## 2019-09-17 DIAGNOSIS — K5900 Constipation, unspecified: Secondary | ICD-10-CM | POA: Diagnosis not present

## 2019-09-17 DIAGNOSIS — F419 Anxiety disorder, unspecified: Secondary | ICD-10-CM | POA: Diagnosis not present

## 2019-09-17 DIAGNOSIS — H409 Unspecified glaucoma: Secondary | ICD-10-CM | POA: Diagnosis not present

## 2019-09-17 DIAGNOSIS — K219 Gastro-esophageal reflux disease without esophagitis: Secondary | ICD-10-CM | POA: Diagnosis not present

## 2019-09-17 DIAGNOSIS — Z7982 Long term (current) use of aspirin: Secondary | ICD-10-CM | POA: Diagnosis not present

## 2019-09-17 DIAGNOSIS — F325 Major depressive disorder, single episode, in full remission: Secondary | ICD-10-CM | POA: Diagnosis not present

## 2019-09-17 DIAGNOSIS — I471 Supraventricular tachycardia: Secondary | ICD-10-CM | POA: Diagnosis not present

## 2019-09-17 DIAGNOSIS — I10 Essential (primary) hypertension: Secondary | ICD-10-CM | POA: Diagnosis not present

## 2019-09-17 DIAGNOSIS — E785 Hyperlipidemia, unspecified: Secondary | ICD-10-CM | POA: Diagnosis not present

## 2019-11-29 DIAGNOSIS — H401132 Primary open-angle glaucoma, bilateral, moderate stage: Secondary | ICD-10-CM | POA: Diagnosis not present

## 2019-12-06 DIAGNOSIS — H43813 Vitreous degeneration, bilateral: Secondary | ICD-10-CM | POA: Diagnosis not present

## 2019-12-06 DIAGNOSIS — H524 Presbyopia: Secondary | ICD-10-CM | POA: Diagnosis not present

## 2019-12-06 DIAGNOSIS — H04123 Dry eye syndrome of bilateral lacrimal glands: Secondary | ICD-10-CM | POA: Diagnosis not present

## 2019-12-06 DIAGNOSIS — H401132 Primary open-angle glaucoma, bilateral, moderate stage: Secondary | ICD-10-CM | POA: Diagnosis not present

## 2019-12-24 DIAGNOSIS — M9902 Segmental and somatic dysfunction of thoracic region: Secondary | ICD-10-CM | POA: Diagnosis not present

## 2019-12-24 DIAGNOSIS — M9901 Segmental and somatic dysfunction of cervical region: Secondary | ICD-10-CM | POA: Diagnosis not present

## 2019-12-24 DIAGNOSIS — M5134 Other intervertebral disc degeneration, thoracic region: Secondary | ICD-10-CM | POA: Diagnosis not present

## 2019-12-24 DIAGNOSIS — M5031 Other cervical disc degeneration,  high cervical region: Secondary | ICD-10-CM | POA: Diagnosis not present

## 2019-12-27 DIAGNOSIS — M9902 Segmental and somatic dysfunction of thoracic region: Secondary | ICD-10-CM | POA: Diagnosis not present

## 2019-12-27 DIAGNOSIS — M5031 Other cervical disc degeneration,  high cervical region: Secondary | ICD-10-CM | POA: Diagnosis not present

## 2019-12-27 DIAGNOSIS — M5134 Other intervertebral disc degeneration, thoracic region: Secondary | ICD-10-CM | POA: Diagnosis not present

## 2019-12-27 DIAGNOSIS — M9901 Segmental and somatic dysfunction of cervical region: Secondary | ICD-10-CM | POA: Diagnosis not present

## 2020-01-02 DIAGNOSIS — Z1389 Encounter for screening for other disorder: Secondary | ICD-10-CM | POA: Diagnosis not present

## 2020-01-02 DIAGNOSIS — Z23 Encounter for immunization: Secondary | ICD-10-CM | POA: Diagnosis not present

## 2020-01-02 DIAGNOSIS — F419 Anxiety disorder, unspecified: Secondary | ICD-10-CM | POA: Diagnosis not present

## 2020-01-02 DIAGNOSIS — Z0000 Encounter for general adult medical examination without abnormal findings: Secondary | ICD-10-CM | POA: Diagnosis not present

## 2020-01-02 DIAGNOSIS — F325 Major depressive disorder, single episode, in full remission: Secondary | ICD-10-CM | POA: Diagnosis not present

## 2020-01-02 DIAGNOSIS — E7800 Pure hypercholesterolemia, unspecified: Secondary | ICD-10-CM | POA: Diagnosis not present

## 2020-01-02 DIAGNOSIS — N952 Postmenopausal atrophic vaginitis: Secondary | ICD-10-CM | POA: Diagnosis not present

## 2020-01-02 DIAGNOSIS — K219 Gastro-esophageal reflux disease without esophagitis: Secondary | ICD-10-CM | POA: Diagnosis not present

## 2020-01-02 DIAGNOSIS — I10 Essential (primary) hypertension: Secondary | ICD-10-CM | POA: Diagnosis not present

## 2020-01-10 ENCOUNTER — Other Ambulatory Visit: Payer: Self-pay | Admitting: Family Medicine

## 2020-01-10 DIAGNOSIS — E2839 Other primary ovarian failure: Secondary | ICD-10-CM

## 2020-01-10 DIAGNOSIS — Z1231 Encounter for screening mammogram for malignant neoplasm of breast: Secondary | ICD-10-CM

## 2020-01-10 DIAGNOSIS — M9902 Segmental and somatic dysfunction of thoracic region: Secondary | ICD-10-CM | POA: Diagnosis not present

## 2020-01-10 DIAGNOSIS — M5031 Other cervical disc degeneration,  high cervical region: Secondary | ICD-10-CM | POA: Diagnosis not present

## 2020-01-10 DIAGNOSIS — M9901 Segmental and somatic dysfunction of cervical region: Secondary | ICD-10-CM | POA: Diagnosis not present

## 2020-01-10 DIAGNOSIS — M5134 Other intervertebral disc degeneration, thoracic region: Secondary | ICD-10-CM | POA: Diagnosis not present

## 2020-02-25 DIAGNOSIS — M9901 Segmental and somatic dysfunction of cervical region: Secondary | ICD-10-CM | POA: Diagnosis not present

## 2020-02-25 DIAGNOSIS — M50322 Other cervical disc degeneration at C5-C6 level: Secondary | ICD-10-CM | POA: Diagnosis not present

## 2020-02-29 DIAGNOSIS — R102 Pelvic and perineal pain: Secondary | ICD-10-CM | POA: Diagnosis not present

## 2020-02-29 DIAGNOSIS — R300 Dysuria: Secondary | ICD-10-CM | POA: Diagnosis not present

## 2020-03-10 ENCOUNTER — Other Ambulatory Visit: Payer: Self-pay

## 2020-03-10 ENCOUNTER — Ambulatory Visit
Admission: RE | Admit: 2020-03-10 | Discharge: 2020-03-10 | Disposition: A | Discharge status: Home / Self Care | Payer: Medicare PPO | Source: Ambulatory Visit | Attending: Family Medicine | Admitting: Family Medicine

## 2020-03-10 DIAGNOSIS — Z1231 Encounter for screening mammogram for malignant neoplasm of breast: Secondary | ICD-10-CM

## 2020-04-30 ENCOUNTER — Other Ambulatory Visit: Payer: Self-pay

## 2020-04-30 ENCOUNTER — Ambulatory Visit
Admission: RE | Admit: 2020-04-30 | Discharge: 2020-04-30 | Disposition: A | Discharge status: Home / Self Care | Payer: Medicare PPO | Source: Ambulatory Visit | Attending: Family Medicine | Admitting: Family Medicine

## 2020-04-30 DIAGNOSIS — Z780 Asymptomatic menopausal state: Secondary | ICD-10-CM | POA: Diagnosis not present

## 2020-04-30 DIAGNOSIS — E2839 Other primary ovarian failure: Secondary | ICD-10-CM

## 2020-05-15 DIAGNOSIS — H401132 Primary open-angle glaucoma, bilateral, moderate stage: Secondary | ICD-10-CM | POA: Diagnosis not present

## 2020-05-15 DIAGNOSIS — H10413 Chronic giant papillary conjunctivitis, bilateral: Secondary | ICD-10-CM | POA: Diagnosis not present

## 2020-05-20 DIAGNOSIS — M5136 Other intervertebral disc degeneration, lumbar region: Secondary | ICD-10-CM | POA: Diagnosis not present

## 2020-05-20 DIAGNOSIS — M5031 Other cervical disc degeneration,  high cervical region: Secondary | ICD-10-CM | POA: Diagnosis not present

## 2020-05-20 DIAGNOSIS — M9903 Segmental and somatic dysfunction of lumbar region: Secondary | ICD-10-CM | POA: Diagnosis not present

## 2020-05-20 DIAGNOSIS — M9901 Segmental and somatic dysfunction of cervical region: Secondary | ICD-10-CM | POA: Diagnosis not present

## 2020-05-21 DIAGNOSIS — M9901 Segmental and somatic dysfunction of cervical region: Secondary | ICD-10-CM | POA: Diagnosis not present

## 2020-05-21 DIAGNOSIS — M9903 Segmental and somatic dysfunction of lumbar region: Secondary | ICD-10-CM | POA: Diagnosis not present

## 2020-05-21 DIAGNOSIS — M5031 Other cervical disc degeneration,  high cervical region: Secondary | ICD-10-CM | POA: Diagnosis not present

## 2020-05-21 DIAGNOSIS — M5136 Other intervertebral disc degeneration, lumbar region: Secondary | ICD-10-CM | POA: Diagnosis not present

## 2020-06-06 DIAGNOSIS — H409 Unspecified glaucoma: Secondary | ICD-10-CM | POA: Diagnosis not present

## 2020-06-06 DIAGNOSIS — I471 Supraventricular tachycardia: Secondary | ICD-10-CM | POA: Diagnosis not present

## 2020-06-06 DIAGNOSIS — F419 Anxiety disorder, unspecified: Secondary | ICD-10-CM | POA: Diagnosis not present

## 2020-06-06 DIAGNOSIS — E785 Hyperlipidemia, unspecified: Secondary | ICD-10-CM | POA: Diagnosis not present

## 2020-06-06 DIAGNOSIS — E039 Hypothyroidism, unspecified: Secondary | ICD-10-CM | POA: Diagnosis not present

## 2020-06-06 DIAGNOSIS — G8929 Other chronic pain: Secondary | ICD-10-CM | POA: Diagnosis not present

## 2020-06-06 DIAGNOSIS — I10 Essential (primary) hypertension: Secondary | ICD-10-CM | POA: Diagnosis not present

## 2020-06-06 DIAGNOSIS — K219 Gastro-esophageal reflux disease without esophagitis: Secondary | ICD-10-CM | POA: Diagnosis not present

## 2020-06-06 DIAGNOSIS — F3342 Major depressive disorder, recurrent, in full remission: Secondary | ICD-10-CM | POA: Diagnosis not present

## 2020-06-11 DIAGNOSIS — H401132 Primary open-angle glaucoma, bilateral, moderate stage: Secondary | ICD-10-CM | POA: Diagnosis not present

## 2020-06-11 DIAGNOSIS — H10413 Chronic giant papillary conjunctivitis, bilateral: Secondary | ICD-10-CM | POA: Diagnosis not present

## 2020-07-30 DIAGNOSIS — R1084 Generalized abdominal pain: Secondary | ICD-10-CM | POA: Diagnosis not present

## 2020-07-30 DIAGNOSIS — K219 Gastro-esophageal reflux disease without esophagitis: Secondary | ICD-10-CM | POA: Diagnosis not present

## 2020-07-30 DIAGNOSIS — R140 Abdominal distension (gaseous): Secondary | ICD-10-CM | POA: Diagnosis not present

## 2020-10-13 DIAGNOSIS — Z20822 Contact with and (suspected) exposure to covid-19: Secondary | ICD-10-CM | POA: Diagnosis not present

## 2020-11-28 DIAGNOSIS — H401132 Primary open-angle glaucoma, bilateral, moderate stage: Secondary | ICD-10-CM | POA: Diagnosis not present

## 2021-01-08 DIAGNOSIS — R350 Frequency of micturition: Secondary | ICD-10-CM | POA: Diagnosis not present

## 2021-01-08 DIAGNOSIS — E7800 Pure hypercholesterolemia, unspecified: Secondary | ICD-10-CM | POA: Diagnosis not present

## 2021-01-08 DIAGNOSIS — K219 Gastro-esophageal reflux disease without esophagitis: Secondary | ICD-10-CM | POA: Diagnosis not present

## 2021-01-08 DIAGNOSIS — I10 Essential (primary) hypertension: Secondary | ICD-10-CM | POA: Diagnosis not present

## 2021-01-08 DIAGNOSIS — Z1389 Encounter for screening for other disorder: Secondary | ICD-10-CM | POA: Diagnosis not present

## 2021-01-08 DIAGNOSIS — F419 Anxiety disorder, unspecified: Secondary | ICD-10-CM | POA: Diagnosis not present

## 2021-01-08 DIAGNOSIS — M62830 Muscle spasm of back: Secondary | ICD-10-CM | POA: Diagnosis not present

## 2021-01-08 DIAGNOSIS — F325 Major depressive disorder, single episode, in full remission: Secondary | ICD-10-CM | POA: Diagnosis not present

## 2021-01-08 DIAGNOSIS — Z0000 Encounter for general adult medical examination without abnormal findings: Secondary | ICD-10-CM | POA: Diagnosis not present

## 2021-01-15 DIAGNOSIS — M50322 Other cervical disc degeneration at C5-C6 level: Secondary | ICD-10-CM | POA: Diagnosis not present

## 2021-01-15 DIAGNOSIS — M9903 Segmental and somatic dysfunction of lumbar region: Secondary | ICD-10-CM | POA: Diagnosis not present

## 2021-01-15 DIAGNOSIS — M5136 Other intervertebral disc degeneration, lumbar region: Secondary | ICD-10-CM | POA: Diagnosis not present

## 2021-01-15 DIAGNOSIS — M9901 Segmental and somatic dysfunction of cervical region: Secondary | ICD-10-CM | POA: Diagnosis not present

## 2021-01-19 DIAGNOSIS — M50322 Other cervical disc degeneration at C5-C6 level: Secondary | ICD-10-CM | POA: Diagnosis not present

## 2021-01-19 DIAGNOSIS — M9903 Segmental and somatic dysfunction of lumbar region: Secondary | ICD-10-CM | POA: Diagnosis not present

## 2021-01-19 DIAGNOSIS — M9901 Segmental and somatic dysfunction of cervical region: Secondary | ICD-10-CM | POA: Diagnosis not present

## 2021-01-19 DIAGNOSIS — M5136 Other intervertebral disc degeneration, lumbar region: Secondary | ICD-10-CM | POA: Diagnosis not present

## 2021-01-30 ENCOUNTER — Other Ambulatory Visit: Payer: Self-pay | Admitting: Family Medicine

## 2021-01-30 DIAGNOSIS — Z1231 Encounter for screening mammogram for malignant neoplasm of breast: Secondary | ICD-10-CM

## 2021-03-11 ENCOUNTER — Other Ambulatory Visit: Payer: Self-pay

## 2021-03-11 ENCOUNTER — Ambulatory Visit
Admission: RE | Admit: 2021-03-11 | Discharge: 2021-03-11 | Disposition: A | Discharge status: Home / Self Care | Payer: Medicare PPO | Source: Ambulatory Visit | Attending: Family Medicine | Admitting: Family Medicine

## 2021-03-11 ENCOUNTER — Other Ambulatory Visit: Payer: Self-pay | Admitting: Family Medicine

## 2021-03-11 DIAGNOSIS — N6310 Unspecified lump in the right breast, unspecified quadrant: Secondary | ICD-10-CM

## 2021-03-11 DIAGNOSIS — Z1231 Encounter for screening mammogram for malignant neoplasm of breast: Secondary | ICD-10-CM

## 2021-03-25 ENCOUNTER — Ambulatory Visit
Admission: RE | Admit: 2021-03-25 | Discharge: 2021-03-25 | Disposition: A | Discharge status: Home / Self Care | Payer: Medicare PPO | Source: Ambulatory Visit | Attending: Family Medicine | Admitting: Family Medicine

## 2021-03-25 DIAGNOSIS — N6310 Unspecified lump in the right breast, unspecified quadrant: Secondary | ICD-10-CM

## 2021-03-25 DIAGNOSIS — R922 Inconclusive mammogram: Secondary | ICD-10-CM | POA: Diagnosis not present

## 2021-04-03 DIAGNOSIS — H401132 Primary open-angle glaucoma, bilateral, moderate stage: Secondary | ICD-10-CM | POA: Diagnosis not present

## 2021-04-21 DIAGNOSIS — N63 Unspecified lump in unspecified breast: Secondary | ICD-10-CM | POA: Diagnosis not present

## 2021-07-09 DIAGNOSIS — M9901 Segmental and somatic dysfunction of cervical region: Secondary | ICD-10-CM | POA: Diagnosis not present

## 2021-07-09 DIAGNOSIS — M50322 Other cervical disc degeneration at C5-C6 level: Secondary | ICD-10-CM | POA: Diagnosis not present

## 2021-07-13 DIAGNOSIS — M9901 Segmental and somatic dysfunction of cervical region: Secondary | ICD-10-CM | POA: Diagnosis not present

## 2021-07-13 DIAGNOSIS — M50322 Other cervical disc degeneration at C5-C6 level: Secondary | ICD-10-CM | POA: Diagnosis not present

## 2021-07-16 DIAGNOSIS — F419 Anxiety disorder, unspecified: Secondary | ICD-10-CM | POA: Diagnosis not present

## 2021-07-16 DIAGNOSIS — E7800 Pure hypercholesterolemia, unspecified: Secondary | ICD-10-CM | POA: Diagnosis not present

## 2021-07-16 DIAGNOSIS — I10 Essential (primary) hypertension: Secondary | ICD-10-CM | POA: Diagnosis not present

## 2021-07-16 DIAGNOSIS — K219 Gastro-esophageal reflux disease without esophagitis: Secondary | ICD-10-CM | POA: Diagnosis not present

## 2021-07-16 DIAGNOSIS — F325 Major depressive disorder, single episode, in full remission: Secondary | ICD-10-CM | POA: Diagnosis not present

## 2021-07-17 ENCOUNTER — Other Ambulatory Visit: Payer: Self-pay | Admitting: Gastroenterology

## 2021-07-17 DIAGNOSIS — R1084 Generalized abdominal pain: Secondary | ICD-10-CM

## 2021-07-17 DIAGNOSIS — K219 Gastro-esophageal reflux disease without esophagitis: Secondary | ICD-10-CM | POA: Diagnosis not present

## 2021-07-17 DIAGNOSIS — R140 Abdominal distension (gaseous): Secondary | ICD-10-CM | POA: Diagnosis not present

## 2021-07-28 ENCOUNTER — Ambulatory Visit
Admission: RE | Admit: 2021-07-28 | Discharge: 2021-07-28 | Disposition: A | Discharge status: Home / Self Care | Payer: Medicare PPO | Source: Ambulatory Visit | Attending: Gastroenterology | Admitting: Gastroenterology

## 2021-07-28 DIAGNOSIS — R140 Abdominal distension (gaseous): Secondary | ICD-10-CM

## 2021-07-28 DIAGNOSIS — Z90710 Acquired absence of both cervix and uterus: Secondary | ICD-10-CM | POA: Diagnosis not present

## 2021-07-28 DIAGNOSIS — R1084 Generalized abdominal pain: Secondary | ICD-10-CM | POA: Diagnosis not present

## 2021-07-28 DIAGNOSIS — R1030 Lower abdominal pain, unspecified: Secondary | ICD-10-CM | POA: Diagnosis not present

## 2021-07-28 DIAGNOSIS — K760 Fatty (change of) liver, not elsewhere classified: Secondary | ICD-10-CM | POA: Diagnosis not present

## 2021-07-28 DIAGNOSIS — Z9049 Acquired absence of other specified parts of digestive tract: Secondary | ICD-10-CM | POA: Diagnosis not present

## 2021-08-17 DIAGNOSIS — H401132 Primary open-angle glaucoma, bilateral, moderate stage: Secondary | ICD-10-CM | POA: Diagnosis not present

## 2021-08-17 DIAGNOSIS — H5203 Hypermetropia, bilateral: Secondary | ICD-10-CM | POA: Diagnosis not present

## 2021-09-07 DIAGNOSIS — F325 Major depressive disorder, single episode, in full remission: Secondary | ICD-10-CM | POA: Diagnosis not present

## 2021-09-07 DIAGNOSIS — E785 Hyperlipidemia, unspecified: Secondary | ICD-10-CM | POA: Diagnosis not present

## 2021-09-07 DIAGNOSIS — K5900 Constipation, unspecified: Secondary | ICD-10-CM | POA: Diagnosis not present

## 2021-09-07 DIAGNOSIS — I471 Supraventricular tachycardia: Secondary | ICD-10-CM | POA: Diagnosis not present

## 2021-09-07 DIAGNOSIS — M1990 Unspecified osteoarthritis, unspecified site: Secondary | ICD-10-CM | POA: Diagnosis not present

## 2021-09-07 DIAGNOSIS — K219 Gastro-esophageal reflux disease without esophagitis: Secondary | ICD-10-CM | POA: Diagnosis not present

## 2021-09-07 DIAGNOSIS — H409 Unspecified glaucoma: Secondary | ICD-10-CM | POA: Diagnosis not present

## 2021-09-07 DIAGNOSIS — R030 Elevated blood-pressure reading, without diagnosis of hypertension: Secondary | ICD-10-CM | POA: Diagnosis not present

## 2021-09-07 DIAGNOSIS — F419 Anxiety disorder, unspecified: Secondary | ICD-10-CM | POA: Diagnosis not present

## 2021-09-08 DIAGNOSIS — L820 Inflamed seborrheic keratosis: Secondary | ICD-10-CM | POA: Diagnosis not present

## 2021-12-21 NOTE — Progress Notes (Unsigned)
Cardiology Office Note   Date:  12/22/2021   ID:  [name], DOB 27-Oct-1932, MRN [id number]  PCP:  Merri Brunette, MD  Cardiologist:   None Referring:  Merri Brunette, MD  Chief Complaint  Patient presents with   Palpitations      History of Present Illness: [name] is a 88 y.o. female who presents for evaluation of palpitations.  I saw her last in 2018.  I saw her for palpitations.  She had mild carotid stenosis.    She has had PSVT.   She has had ablation.     She said that she has been doing well.  She has not been having any more the symptoms that she had with her SVT.  However, on August 30 she was working in [employer] garden.  She felt her heart flipping and flopping.  It is every third beat and then a brief 6 beat.  It was not a sustained rapid rate.  She went in the house.  She did not think she was overly dehydrated or hot.  She stopped what she was doing and slowly resolved.  She did not have chest discomfort, neck or arm discomfort.  She did not have presyncope or syncope.  It has not returned since then.  She has been able to be active without bringing on any symptoms.   Past Medical History:  Diagnosis Date   GERD (gastroesophageal reflux disease)    Hyperlipidemia    mixed   PAT (paroxysmal atrial tachycardia) (HCC)    PSVT (paroxysmal supraventricular tachycardia) (HCC)     Past Surgical History:  Procedure Laterality Date   ABDOMINAL HYSTERECTOMY     APPENDECTOMY     BREAST SURGERY     biopsy   CARDIAC ELECTROPHYSIOLOGY MAPPING AND ABLATION     CARDIOVERSION     CATARACT EXTRACTION Bilateral    CHOLECYSTECTOMY     TONSILLECTOMY       Current Outpatient Medications  Medication Sig Dispense Refill   aspirin 81 MG tablet Take 81 mg by mouth daily.       Calcium Carbonate-Vitamin D (CALCIUM 600 + D PO) Take by mouth daily.     Cholecalciferol 1000 units capsule Take 1,000 Units by mouth daily.      dorzolamide-timolol  (COSOPT) 22.3-6.8 MG/ML ophthalmic solution Place 1 drop into both eyes 2 (two) times daily.     escitalopram (LEXAPRO) 10 MG tablet Take 10 mg by mouth daily.     fexofenadine (ALLEGRA) 180 MG tablet Take 180 mg by mouth daily as needed for allergies or rhinitis.     LORazepam (ATIVAN) 0.5 MG tablet Take 0.5 mg by mouth 2 (two) times daily.     Multiple Vitamin (MULTIVITAMIN) tablet Take 1 tablet by mouth daily.      Omega-3 Fatty Acids (FISH OIL) 1000 MG CAPS Take 1 capsule by mouth daily.      pantoprazole (PROTONIX) 40 MG tablet Take 40 mg by mouth daily.       Polyethyl Glycol-Propyl Glycol (SYSTANE) 0.4-0.3 % GEL ophthalmic gel Place 1 application into both eyes 2 (two) times daily.     rosuvastatin (CRESTOR) 5 MG tablet Take 5 mg by mouth daily.       No current facility-administered medications for this visit.    Allergies:   Codeine, Erythromycin, Metronidazole, Pseudoephedrine, Sertraline hcl, and Venlafaxine    Social History:  The patient  reports that she has never smoked. She has never  used smokeless tobacco. She reports that she does not drink alcohol and does not use drugs.   Family History:  The patient's family history includes Coronary artery disease in an other family member; Heart attack (age of onset: 23) in her father; Stroke in her mother.    ROS:  Please see the history of present illness.   Otherwise, review of systems are positive for none.   All other systems are reviewed and negative.    PHYSICAL EXAM: VS:  BP 136/78   Pulse 66   Ht 5\' 4"  (1.626 m)   Wt 151 lb 6.4 oz (68.7 kg)   SpO2 98%   BMI 25.99 kg/m  , BMI Body mass index is 25.99 kg/m. GENERAL:  Well appearing HEENT:  Pupils equal round and reactive, fundi not visualized, oral mucosa unremarkable NECK:  No jugular venous distention, waveform within normal limits, carotid upstroke brisk and symmetric, no bruits, no thyromegaly LYMPHATICS:  No cervical, inguinal adenopathy LUNGS:  Clear to  auscultation bilaterally BACK:  No CVA tenderness CHEST:  Unremarkable HEART:  PMI not displaced or sustained,S1 and S2 within normal limits, no S3, no S4, no clicks, no rubs, no murmurs ABD:  Flat, positive bowel sounds normal in frequency in pitch, no bruits, no rebound, no guarding, no midline pulsatile mass, no hepatomegaly, no splenomegaly EXT:  2 plus pulses throughout, no edema, no cyanosis no clubbing SKIN:  No rashes no nodules NEURO:  Cranial nerves II through XII grossly intact, motor grossly intact throughout PSYCH:  Cognitively intact, oriented to person place and time    EKG:  EKG is ordered today. The ekg ordered today demonstrates sinus rhythm, rate 66, unusual P wave axis, probably limb lead reversal.  Poor anterior R wave progression   Recent Labs: No results found for requested labs within last 365 days.    Lipid Panel No results found for: "CHOL", "TRIG", "HDL", "CHOLHDL", "VLDL", "LDLCALC", "LDLDIRECT"    Wt Readings from Last 3 Encounters:  12/22/21 151 lb 6.4 oz (68.7 kg)  02/25/17 153 lb 9.6 oz (69.7 kg)  07/07/16 150 lb (68 kg)      Other studies Reviewed: Additional studies/ records that were reviewed today include: Labs. Review of the above records demonstrates:  Please see elsewhere in the note.     ASSESSMENT AND PLAN:  Palpitations: The patient had 1 day of palpitations are probably PVCs or PACs.  She has not had any recurrence.  No change in therapy.  I did advise her to look into getting a Fitbit or Kardia.  She is up-to-date with labs including thyroid and electrolytes.  Otherwise I do not suspect structural heart disease.  No further work-up is planned at this point.   Current medicines are reviewed at length with the patient today.  The patient does not have concerns regarding medicines.  The following changes have been made:  no change  Labs/ tests ordered today include:   Orders Placed This Encounter  Procedures   EKG 12-Lead      Disposition:   FU with me in a couple of years.    Signed, Minus Breeding, MD  12/22/2021 2:40 PM    [place]

## 2021-12-22 ENCOUNTER — Encounter: Payer: Self-pay | Admitting: Cardiology

## 2021-12-22 ENCOUNTER — Ambulatory Visit: Payer: Medicare PPO | Attending: Cardiology | Admitting: Cardiology

## 2021-12-22 VITALS — BP 136/78 | HR 66 | Ht 64.0 in | Wt 151.4 lb

## 2021-12-22 DIAGNOSIS — I471 Supraventricular tachycardia: Secondary | ICD-10-CM | POA: Diagnosis not present

## 2021-12-22 NOTE — Patient Instructions (Signed)
  Follow-Up: At [facility], you and your health needs are our priority.  As part of our continuing mission to provide you with exceptional heart care, we have created designated Provider Care Teams.  These Care Teams include your primary Cardiologist (physician) and Advanced Practice Providers (APPs -  Physician Assistants and Nurse Practitioners) who all work together to provide you with the care you need, when you need it.  We recommend signing up for the patient portal called "MyChart".  Sign up information is provided on this After Visit Summary.  MyChart is used to connect with patients for Virtual Visits (Telemedicine).  Patients are able to view lab/test results, encounter notes, upcoming appointments, etc.  Non-urgent messages can be sent to your provider as well.   To learn more about what you can do with MyChart, go to [email or web address].    Your next appointment:   2 year(s)  The format for your next appointment:   In Person  Provider:   Minus Breeding MD   Other Instructions  Fit bit or Jodelle Red

## 2021-12-23 ENCOUNTER — Encounter: Payer: Self-pay | Admitting: Cardiology

## 2021-12-28 DIAGNOSIS — H401131 Primary open-angle glaucoma, bilateral, mild stage: Secondary | ICD-10-CM | POA: Diagnosis not present

## 2021-12-28 MED ORDER — DILTIAZEM HCL 30 MG PO TABS: 30.0000 mg | ORAL_TABLET | Freq: Four times a day (QID) | ORAL | 1 refills | Status: AC

## 2022-02-09 DIAGNOSIS — E7800 Pure hypercholesterolemia, unspecified: Secondary | ICD-10-CM | POA: Diagnosis not present

## 2022-02-09 DIAGNOSIS — Z1331 Encounter for screening for depression: Secondary | ICD-10-CM | POA: Diagnosis not present

## 2022-02-09 DIAGNOSIS — F419 Anxiety disorder, unspecified: Secondary | ICD-10-CM | POA: Diagnosis not present

## 2022-02-09 DIAGNOSIS — Z0000 Encounter for general adult medical examination without abnormal findings: Secondary | ICD-10-CM | POA: Diagnosis not present

## 2022-02-09 DIAGNOSIS — I471 Supraventricular tachycardia, unspecified: Secondary | ICD-10-CM | POA: Diagnosis not present

## 2022-02-09 DIAGNOSIS — F325 Major depressive disorder, single episode, in full remission: Secondary | ICD-10-CM | POA: Diagnosis not present

## 2022-02-09 DIAGNOSIS — Z23 Encounter for immunization: Secondary | ICD-10-CM | POA: Diagnosis not present

## 2022-02-09 DIAGNOSIS — K219 Gastro-esophageal reflux disease without esophagitis: Secondary | ICD-10-CM | POA: Diagnosis not present

## 2022-02-09 DIAGNOSIS — I10 Essential (primary) hypertension: Secondary | ICD-10-CM | POA: Diagnosis not present

## 2022-02-22 ENCOUNTER — Other Ambulatory Visit: Payer: Self-pay | Admitting: Family Medicine

## 2022-02-22 DIAGNOSIS — Z1231 Encounter for screening mammogram for malignant neoplasm of breast: Secondary | ICD-10-CM

## 2022-04-09 DIAGNOSIS — J3489 Other specified disorders of nose and nasal sinuses: Secondary | ICD-10-CM | POA: Diagnosis not present

## 2022-04-09 DIAGNOSIS — H903 Sensorineural hearing loss, bilateral: Secondary | ICD-10-CM | POA: Diagnosis not present

## 2022-04-20 ENCOUNTER — Ambulatory Visit
Admission: RE | Admit: 2022-04-20 | Discharge: 2022-04-20 | Disposition: A | Discharge status: Home / Self Care | Payer: Medicare PPO | Source: Ambulatory Visit | Attending: Family Medicine | Admitting: Family Medicine

## 2022-04-20 DIAGNOSIS — Z1231 Encounter for screening mammogram for malignant neoplasm of breast: Secondary | ICD-10-CM

## 2022-05-03 DIAGNOSIS — H401131 Primary open-angle glaucoma, bilateral, mild stage: Secondary | ICD-10-CM | POA: Diagnosis not present

## 2022-05-03 DIAGNOSIS — H04123 Dry eye syndrome of bilateral lacrimal glands: Secondary | ICD-10-CM | POA: Diagnosis not present

## 2022-07-21 DIAGNOSIS — L708 Other acne: Secondary | ICD-10-CM | POA: Diagnosis not present

## 2022-07-21 DIAGNOSIS — L820 Inflamed seborrheic keratosis: Secondary | ICD-10-CM | POA: Diagnosis not present

## 2022-07-21 DIAGNOSIS — L821 Other seborrheic keratosis: Secondary | ICD-10-CM | POA: Diagnosis not present

## 2022-08-09 DIAGNOSIS — L820 Inflamed seborrheic keratosis: Secondary | ICD-10-CM | POA: Diagnosis not present

## 2022-08-11 DIAGNOSIS — E7800 Pure hypercholesterolemia, unspecified: Secondary | ICD-10-CM | POA: Diagnosis not present

## 2022-08-11 DIAGNOSIS — I10 Essential (primary) hypertension: Secondary | ICD-10-CM | POA: Diagnosis not present

## 2022-08-11 DIAGNOSIS — F325 Major depressive disorder, single episode, in full remission: Secondary | ICD-10-CM | POA: Diagnosis not present

## 2022-08-11 DIAGNOSIS — I471 Supraventricular tachycardia, unspecified: Secondary | ICD-10-CM | POA: Diagnosis not present

## 2022-08-11 DIAGNOSIS — F419 Anxiety disorder, unspecified: Secondary | ICD-10-CM | POA: Diagnosis not present

## 2022-08-11 DIAGNOSIS — K219 Gastro-esophageal reflux disease without esophagitis: Secondary | ICD-10-CM | POA: Diagnosis not present

## 2022-08-16 DIAGNOSIS — H409 Unspecified glaucoma: Secondary | ICD-10-CM | POA: Diagnosis not present

## 2022-08-16 DIAGNOSIS — F325 Major depressive disorder, single episode, in full remission: Secondary | ICD-10-CM | POA: Diagnosis not present

## 2022-08-16 DIAGNOSIS — K5900 Constipation, unspecified: Secondary | ICD-10-CM | POA: Diagnosis not present

## 2022-08-16 DIAGNOSIS — I10 Essential (primary) hypertension: Secondary | ICD-10-CM | POA: Diagnosis not present

## 2022-08-16 DIAGNOSIS — K219 Gastro-esophageal reflux disease without esophagitis: Secondary | ICD-10-CM | POA: Diagnosis not present

## 2022-08-16 DIAGNOSIS — E785 Hyperlipidemia, unspecified: Secondary | ICD-10-CM | POA: Diagnosis not present

## 2022-08-16 DIAGNOSIS — M1990 Unspecified osteoarthritis, unspecified site: Secondary | ICD-10-CM | POA: Diagnosis not present

## 2022-08-16 DIAGNOSIS — R32 Unspecified urinary incontinence: Secondary | ICD-10-CM | POA: Diagnosis not present

## 2022-08-16 DIAGNOSIS — I471 Supraventricular tachycardia, unspecified: Secondary | ICD-10-CM | POA: Diagnosis not present

## 2022-09-16 DIAGNOSIS — H401131 Primary open-angle glaucoma, bilateral, mild stage: Secondary | ICD-10-CM | POA: Diagnosis not present

## 2022-09-16 DIAGNOSIS — H524 Presbyopia: Secondary | ICD-10-CM | POA: Diagnosis not present

## 2023-01-07 DIAGNOSIS — H401131 Primary open-angle glaucoma, bilateral, mild stage: Secondary | ICD-10-CM | POA: Diagnosis not present

## 2023-01-07 DIAGNOSIS — H04123 Dry eye syndrome of bilateral lacrimal glands: Secondary | ICD-10-CM | POA: Diagnosis not present

## 2023-03-10 DIAGNOSIS — Z0000 Encounter for general adult medical examination without abnormal findings: Secondary | ICD-10-CM | POA: Diagnosis not present

## 2023-03-10 DIAGNOSIS — I471 Supraventricular tachycardia, unspecified: Secondary | ICD-10-CM | POA: Diagnosis not present

## 2023-03-10 DIAGNOSIS — K219 Gastro-esophageal reflux disease without esophagitis: Secondary | ICD-10-CM | POA: Diagnosis not present

## 2023-03-10 DIAGNOSIS — Z23 Encounter for immunization: Secondary | ICD-10-CM | POA: Diagnosis not present

## 2023-03-10 DIAGNOSIS — I10 Essential (primary) hypertension: Secondary | ICD-10-CM | POA: Diagnosis not present

## 2023-03-10 DIAGNOSIS — F419 Anxiety disorder, unspecified: Secondary | ICD-10-CM | POA: Diagnosis not present

## 2023-03-10 DIAGNOSIS — Z1331 Encounter for screening for depression: Secondary | ICD-10-CM | POA: Diagnosis not present

## 2023-03-10 DIAGNOSIS — E7800 Pure hypercholesterolemia, unspecified: Secondary | ICD-10-CM | POA: Diagnosis not present

## 2023-03-10 DIAGNOSIS — F325 Major depressive disorder, single episode, in full remission: Secondary | ICD-10-CM | POA: Diagnosis not present

## 2023-03-24 ENCOUNTER — Emergency Department (HOSPITAL_BASED_OUTPATIENT_CLINIC_OR_DEPARTMENT_OTHER)
Admission: EM | Admit: 2023-03-24 | Discharge: 2023-03-24 | Disposition: A | Discharge status: Home / Self Care | Payer: Medicare PPO | Attending: Emergency Medicine | Admitting: Emergency Medicine

## 2023-03-24 ENCOUNTER — Other Ambulatory Visit: Payer: Self-pay

## 2023-03-24 ENCOUNTER — Emergency Department (HOSPITAL_BASED_OUTPATIENT_CLINIC_OR_DEPARTMENT_OTHER): Payer: Medicare PPO

## 2023-03-24 ENCOUNTER — Encounter (HOSPITAL_BASED_OUTPATIENT_CLINIC_OR_DEPARTMENT_OTHER): Payer: Self-pay

## 2023-03-24 DIAGNOSIS — R0789 Other chest pain: Secondary | ICD-10-CM | POA: Diagnosis not present

## 2023-03-24 DIAGNOSIS — R4182 Altered mental status, unspecified: Secondary | ICD-10-CM | POA: Diagnosis not present

## 2023-03-24 DIAGNOSIS — R110 Nausea: Secondary | ICD-10-CM | POA: Insufficient documentation

## 2023-03-24 DIAGNOSIS — R079 Chest pain, unspecified: Secondary | ICD-10-CM | POA: Diagnosis not present

## 2023-03-24 DIAGNOSIS — Z7982 Long term (current) use of aspirin: Secondary | ICD-10-CM | POA: Insufficient documentation

## 2023-03-24 DIAGNOSIS — F419 Anxiety disorder, unspecified: Secondary | ICD-10-CM | POA: Insufficient documentation

## 2023-03-24 DIAGNOSIS — R1084 Generalized abdominal pain: Secondary | ICD-10-CM | POA: Insufficient documentation

## 2023-03-24 DIAGNOSIS — R42 Dizziness and giddiness: Secondary | ICD-10-CM | POA: Diagnosis not present

## 2023-03-24 DIAGNOSIS — I2510 Atherosclerotic heart disease of native coronary artery without angina pectoris: Secondary | ICD-10-CM | POA: Diagnosis not present

## 2023-03-24 DIAGNOSIS — K449 Diaphragmatic hernia without obstruction or gangrene: Secondary | ICD-10-CM | POA: Diagnosis not present

## 2023-03-24 LAB — LACTIC ACID, PLASMA: Lactic Acid, Venous: 1.1 mmol/L (ref 0.5–1.9)

## 2023-03-24 LAB — BASIC METABOLIC PANEL
Anion gap: 13 (ref 5–15)
BUN: 13 mg/dL (ref 8–23)
CO2: 25 mmol/L (ref 22–32)
Calcium: 9.4 mg/dL (ref 8.9–10.3)
Chloride: 102 mmol/L (ref 98–111)
Creatinine, Ser: 0.68 mg/dL (ref 0.44–1.00)
GFR, Estimated: >60 mL/min (ref >60)
Glucose, Bld: 132 mg/dL — ABNORMAL HIGH (ref 70–99)
Potassium: 3.8 mmol/L (ref 3.5–5.1)
Sodium: 140 mmol/L (ref 135–145)

## 2023-03-24 LAB — CBC
HCT: 41.2 % (ref 36.0–46.0)
Hemoglobin: 14 g/dL (ref 12.0–15.0)
MCH: 32.1 pg (ref 26.0–34.0)
MCHC: 34 g/dL (ref 30.0–36.0)
MCV: 94.5 fL (ref 80.0–100.0)
Platelets: 183 10*3/uL (ref 150–400)
RBC: 4.36 MIL/uL (ref 3.87–5.11)
RDW: 13.2 % (ref 11.5–15.5)
WBC: 5.1 10*3/uL (ref 4.0–10.5)
nRBC: 0 % (ref 0.0–0.2)

## 2023-03-24 LAB — HEPATIC FUNCTION PANEL
ALT: 21 U/L (ref 0–44)
AST: 30 U/L (ref 15–41)
Albumin: 4.3 g/dL (ref 3.5–5.0)
Alkaline Phosphatase: 58 U/L (ref 38–126)
Bilirubin, Direct: 0.1 mg/dL (ref 0.0–0.2)
Indirect Bilirubin: 0.6 mg/dL (ref 0.3–0.9)
Total Bilirubin: 0.7 mg/dL (ref <1.2)
Total Protein: 7.4 g/dL (ref 6.5–8.1)

## 2023-03-24 LAB — URINALYSIS, MICROSCOPIC (REFLEX)

## 2023-03-24 LAB — URINALYSIS, ROUTINE W REFLEX MICROSCOPIC
Bilirubin Urine: NEGATIVE
Glucose, UA: NEGATIVE mg/dL
Hgb urine dipstick: NEGATIVE
Ketones, ur: NEGATIVE mg/dL
Nitrite: NEGATIVE
Protein, ur: NEGATIVE mg/dL
Specific Gravity, Urine: 1.015 (ref 1.005–1.030)
pH: 8.5 — ABNORMAL HIGH (ref 5.0–8.0)

## 2023-03-24 LAB — LIPASE, BLOOD: Lipase: 25 U/L (ref 11–51)

## 2023-03-24 LAB — TROPONIN I (HIGH SENSITIVITY)
Troponin I (High Sensitivity): 6 ng/L (ref <18)
Troponin I (High Sensitivity): 7 ng/L (ref <18)

## 2023-03-24 MED ORDER — LORAZEPAM 0.5 MG PO TABS: 0.5000 mg | ORAL_TABLET | Freq: Two times a day (BID) | ORAL | 0 refills | Status: AC

## 2023-03-24 MED ORDER — LORAZEPAM 1 MG PO TABS
0.5000 mg | ORAL_TABLET | Freq: Once | ORAL | Status: AC
  Administered 2023-03-24: 0.5 mg via ORAL
  Filled 2023-03-24: qty 1

## 2023-03-24 MED ORDER — FAMOTIDINE 20 MG PO TABS
20.0000 mg | ORAL_TABLET | Freq: Once | ORAL | Status: AC
  Administered 2023-03-24: 20 mg via ORAL
  Filled 2023-03-24: qty 1

## 2023-03-24 MED ORDER — ACETAMINOPHEN 500 MG PO TABS
1000.0000 mg | ORAL_TABLET | Freq: Once | ORAL | Status: AC
  Administered 2023-03-24: 1000 mg via ORAL
  Filled 2023-03-24: qty 2

## 2023-03-24 MED ORDER — IOHEXOL 350 MG/ML SOLN
100.0000 mL | Freq: Once | INTRAVENOUS | Status: AC | PRN
  Administered 2023-03-24: 100 mL via INTRAVENOUS

## 2023-03-24 NOTE — ED Notes (Signed)
Patient transported to CT 

## 2023-03-24 NOTE — ED Notes (Signed)
Patient transported to CT via stretcher.

## 2023-03-24 NOTE — Discharge Instructions (Addendum)
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  Your lab work is otherwise reassuring.  I sent a prescription refill for your lorazepam.  If you develop worsening pain including worsening abdominal pain, chest pain, difficulty breathing or any other new concerning symptoms you should return to the ED.

## 2023-03-24 NOTE — ED Notes (Signed)
Reports CP starting on 03/23/2023 at 1330 with dizziness.

## 2023-03-24 NOTE — ED Triage Notes (Signed)
Patent c/o CP 3/10 started on 03/23/2023 at 1330 with dizziness overnight, denies SOB. Patient reports she's has been out of lorazepam 0.5mg  since 03/22/2023, anxiety 10/10 starting 03/23/2023.

## 2023-03-24 NOTE — ED Notes (Signed)
 Discharge paperwork reviewed entirely with patient, including follow up care. Pain was under control. The patient received instruction and coaching on their prescriptions, and all follow-up questions were answered.  Pt verbalized understanding as well as all parties involved. No questions or concerns voiced at the time of discharge. No acute distress noted.   Pt was wheeled out to the PVA in a wheelchair without incident.  Pt advised they will notify their PCP immediately.

## 2023-03-24 NOTE — ED Provider Notes (Signed)
[facility] Provider Note   CSN: [id number] Arrival date & time: 03/24/23  0840     History  Chief Complaint  Patient presents with   Chest Pain    [name] is a 89 y.o. female.   Chest Pain 89 year old female history of GERD, hyperlipidemia presenting for multiple concerns.  Patient states she ran out of her lorazepam on Monday which she is taken for about 10 years, 0.5 mg twice daily.  She reports starting 130 yesterday she is felt very poor.  She has been anxious, fatigued, she had episode of chest pain yesterday which she has difficulty describing and has had some dizziness.  No shortness of breath.  No pleuritic pain.  She currently has no chest pain.  She reports feeling tingly all over in her hands and feet on both sides.  No speech difficulties or other weakness or numbness.  Family states she is speaking at her baseline.  They are concerned she may be anxious related to missing her lorazepam.  She also reports a mild nausea and generalized abdominal pain currently.  No vomiting, regular bowel movements.     Home Medications Prior to Admission medications   Medication Sig Start Date End Date Taking? Authorizing Provider  aspirin 81 MG tablet Take 81 mg by mouth daily.     Yes [provider]  Calcium Carbonate-Vitamin D (CALCIUM 600 + D PO) Take by mouth daily.   Yes [provider]  Cholecalciferol 1000 units capsule Take 1,000 Units by mouth daily.    Yes [provider]  diltiazem (CARDIZEM) 30 MG tablet Take 1 tablet (30 mg total) by mouth 4 (four) times daily. 12/28/21  Yes Rollene Rotunda, MD  dorzolamide-timolol (COSOPT) 22.3-6.8 MG/ML ophthalmic solution Place 1 drop into both eyes 2 (two) times daily.   Yes [provider]  escitalopram (LEXAPRO) 10 MG tablet Take 10 mg by mouth daily. 10/07/14  Yes [provider]  Multiple Vitamin (MULTIVITAMIN) tablet Take 1 tablet  by mouth daily.    Yes [provider]  Omega-3 Fatty Acids (FISH OIL) 1000 MG CAPS Take 1 capsule by mouth daily.    Yes [provider]  pantoprazole (PROTONIX) 40 MG tablet Take 40 mg by mouth daily.     Yes [provider]  rosuvastatin (CRESTOR) 5 MG tablet Take 5 mg by mouth daily.     Yes [provider]  fexofenadine (ALLEGRA) 180 MG tablet Take 180 mg by mouth daily as needed for allergies or rhinitis.    [provider]  LORazepam (ATIVAN) 0.5 MG tablet Take 1 tablet (0.5 mg total) by mouth 2 (two) times daily. 03/24/23   [name], MD  Polyethyl Glycol-Propyl Glycol (SYSTANE) 0.4-0.3 % GEL ophthalmic gel Place 1 application into both eyes 2 (two) times daily.    [provider]      Allergies    Codeine, Erythromycin, Metronidazole, Pseudoephedrine, Sertraline hcl, and Venlafaxine    Review of Systems   Review of Systems  Cardiovascular:  Positive for chest pain.  Review of systems completed and notable as per HPI.  ROS otherwise negative.   Physical Exam Updated Vital Signs BP (!) 163/56   Pulse 71   Temp 98.4 F (36.9 C) (Oral)   Resp 20   Ht 5\' 3"  (1.6 m)   Wt 68 kg   SpO2 100%   BMI 26.57 kg/m  Physical Exam Vitals and nursing note  reviewed.  Constitutional:      Appearance: She is well-developed. She is not ill-appearing or diaphoretic.     Comments: Anxious  HENT:     Head: Normocephalic and atraumatic.     Nose: Nose normal.     Mouth/Throat:     Mouth: Mucous membranes are moist.     Pharynx: Oropharynx is clear.  Eyes:     Extraocular Movements: Extraocular movements intact.     Conjunctiva/sclera: Conjunctivae normal.     Pupils: Pupils are equal, round, and reactive to light.  Cardiovascular:     Rate and Rhythm: Normal rate and regular rhythm.     Pulses: Normal pulses.     Heart sounds: Normal heart sounds. No murmur heard. Pulmonary:     Effort: Pulmonary effort is normal. No  respiratory distress.     Breath sounds: Normal breath sounds.  Abdominal:     Palpations: Abdomen is soft.     Tenderness: There is abdominal tenderness. There is no guarding or rebound.     Comments: Mild diffuse tenderness  Musculoskeletal:        General: No swelling.     Cervical back: Neck supple.     Right lower leg: No edema.     Left lower leg: No edema.  Skin:    General: Skin is warm and dry.     Capillary Refill: Capillary refill takes less than 2 seconds.  Neurological:     General: No focal deficit present.     Mental Status: She is alert and oriented to person, place, and time. Mental status is at baseline.     Cranial Nerves: No cranial nerve deficit.     Sensory: No sensory deficit.     Motor: No weakness.     Coordination: Coordination normal.     Deep Tendon Reflexes: Reflexes normal.     Comments: Anxious.  Appropriately oriented.  Normal finger-to-nose.  Cranial nerves and extraocular movements are intact.  No visual field cuts.  No aphasia no neglect.  Normal speech.  Normal strength and sensation in all extremities.  Psychiatric:        Mood and Affect: Mood normal.     ED Results / Procedures / Treatments   Labs (all labs ordered are listed, but only abnormal results are displayed) Labs Reviewed  BASIC METABOLIC PANEL - Abnormal; Notable for the following components:      Result Value   Glucose, Bld 132 (*)    All other components within normal limits  URINALYSIS, ROUTINE W REFLEX MICROSCOPIC - Abnormal; Notable for the following components:   pH 8.5 (*)    Leukocytes,Ua SMALL (*)    All other components within normal limits  URINALYSIS, MICROSCOPIC (REFLEX) - Abnormal; Notable for the following components:   Bacteria, UA FEW (*)    All other components within normal limits  CBC  HEPATIC FUNCTION PANEL  LIPASE, BLOOD  LACTIC ACID, PLASMA  TROPONIN I (HIGH SENSITIVITY)  TROPONIN I (HIGH SENSITIVITY)    EKG EKG  Interpretation Date/Time:  Thursday March 24 2023 08:49:21 EST Ventricular Rate:  69 PR Interval:  157 QRS Duration:  90 QT Interval:  439 QTC Calculation: 471 R Axis:   72  Text Interpretation: Sinus rhythm No significant change since last tracing Confirmed by Fulton Reek [phone]) on 03/24/2023 8:57:28 AM  Radiology CT Angio Chest/Abd/Pel for Dissection W and/or W/WO Result Date: 03/24/2023 CLINICAL DATA:  Chest pain, dizziness.  Acute aortic syndrome. EXAM: CT ANGIOGRAPHY CHEST,  ABDOMEN AND PELVIS TECHNIQUE: Non-contrast CT of the chest was initially obtained. Multidetector CT imaging through the chest, abdomen and pelvis was performed using the standard protocol during bolus administration of intravenous contrast. Multiplanar reconstructed images and MIPs were obtained and reviewed to evaluate the vascular anatomy. RADIATION DOSE REDUCTION: This exam was performed according to the departmental dose-optimization program which includes automated exposure control, adjustment of the mA and/or kV according to patient size and/or use of iterative reconstruction technique. CONTRAST:  OMNIPAQUE IOHEXOL 350 MG/ML SOLN COMPARISON:  January 27, 2015. FINDINGS: CTA CHEST FINDINGS Cardiovascular: Atherosclerosis of thoracic aorta is noted without aneurysm or dissection. Normal cardiac size. No pericardial effusion. Mild coronary artery calcifications. Mediastinum/Nodes: Small sliding-type hiatal hernia. No significant adenopathy. Thyroid gland is unremarkable. Lungs/Pleura: No pneumothorax or pleural effusion is noted. Mild biapical scarring is noted. No acute pulmonary disease is noted. Musculoskeletal: No chest wall abnormality. No acute or significant osseous findings. Review of the MIP images confirms the above findings. CTA ABDOMEN AND PELVIS FINDINGS VASCULAR Aorta: Atherosclerosis of abdominal aorta is noted without aneurysm or dissection. Celiac: Severe narrowing is noted at origin secondary  to calcified plaque. SMA: Patent without evidence of aneurysm, dissection, vasculitis or significant stenosis. Renals: Both renal arteries are patent without evidence of aneurysm, dissection, vasculitis, fibromuscular dysplasia or significant stenosis. IMA: Patent without evidence of aneurysm, dissection, vasculitis or significant stenosis. Inflow: Patent without evidence of aneurysm, dissection, vasculitis or significant stenosis. Veins: No obvious venous abnormality within the limitations of this arterial phase study. Review of the MIP images confirms the above findings. NON-VASCULAR Hepatobiliary: No focal liver abnormality is seen. Status post cholecystectomy. No biliary dilatation. Pancreas: Unremarkable. No pancreatic ductal dilatation or surrounding inflammatory changes. Spleen: Normal in size without focal abnormality. Adrenals/Urinary Tract: Adrenal glands are unremarkable. Kidneys are normal, without renal calculi, focal lesion, or hydronephrosis. Bladder is unremarkable. Stomach/Bowel: Stomach is within normal limits. Status post appendectomy. No evidence of bowel wall thickening, distention, or inflammatory changes. Lymphatic: No adenopathy. Reproductive: Status post hysterectomy. No adnexal masses. Other: No abdominal wall hernia or abnormality. No abdominopelvic ascites. Musculoskeletal: No acute or significant osseous findings. Review of the MIP images confirms the above findings. IMPRESSION: No evidence of thoracic or abdominal aortic aneurysm or dissection. 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 Small sliding-type hiatal hernia is noted. Coronary artery calcifications are noted. Aortic Atherosclerosis (ICD10-I70.0). Electronically Signed   By: Lupita Raider M.D.   On: 03/24/2023 10:40   CT Head Wo Contrast Result Date: 03/24/2023 CLINICAL DATA:  Chest pain and dizziness, mental status change EXAM: CT HEAD WITHOUT CONTRAST TECHNIQUE: Contiguous axial  images were obtained from the base of the skull through the vertex without intravenous contrast. RADIATION DOSE REDUCTION: This exam was performed according to the departmental dose-optimization program which includes automated exposure control, adjustment of the mA and/or kV according to patient size and/or use of iterative reconstruction technique. COMPARISON:  CT and MRI 03/01/2013 FINDINGS: Brain: No intracranial hemorrhage, mass effect, or evidence of acute infarct. No hydrocephalus. No extra-axial fluid collection. Age-commensurate cerebral atrophy and chronic small vessel ischemic disease. Vascular: No hyperdense vessel. Intracranial arterial calcification. Skull: No fracture or focal lesion. Sinuses/Orbits: No acute finding. Other: None. IMPRESSION: No acute intracranial abnormality. Electronically Signed   By: Minerva Fester M.D.   On: 03/24/2023 10:29    Procedures Procedures    Medications Ordered in ED Medications  LORazepam (ATIVAN) tablet 0.5 mg (0.5 mg Oral Given 03/24/23  1020)  iohexol (OMNIPAQUE) 350 MG/ML injection 100 mL (100 mLs Intravenous Contrast Given 03/24/23 0956)  acetaminophen (TYLENOL) tablet 1,000 mg (1,000 mg Oral Given 03/24/23 1147)  famotidine (PEPCID) tablet 20 mg (20 mg Oral Given 03/24/23 1147)    ED Course/ Medical Decision Making/ A&P Clinical Course as of 03/24/23 1539  Thu Mar 24, 2023  1359 [facility]: similar to 2016, no need for follow up [JD]    Clinical Course User Index [JD] [name], MD                                 Medical Decision Making Amount and/or Complexity of Data Reviewed Labs: ordered. Radiology: ordered.  Risk OTC drugs. Prescription drug management.   Medical Decision Making:   Ocea Brotman is a 89 y.o. female who presented to the ED today with anxiety, chest pain, belly pain, tingling sensation.  She arrives hypertensive with slightly wide pulse pressure.  On exam she is anxious but otherwise  well-appearing.  She reports tingling all over but she has objectively normal neurologic exam, lower concern for stroke no indication for code stroke at this time.  She had some chest pain yesterday and has some abdominal pain, she has difficulty describing this.  Given this we will obtain CT dissection protocol to evaluate the chest and abdomen.  Lower concern for PE given no hypoxia, shortness of breath.  Will get a CT head given her feeling tingling all over although seems less like stroke.  She does report running out of Ativan recently, will give her her home dose of Ativan to see if this helps because she could have some degree of withdrawal from her benzodiazepines.   Patient placed on continuous vitals and telemetry monitoring while in ED which was reviewed periodically.  Reviewed and confirmed nursing documentation for past medical history, family history, social history.  Reassessment and Plan:   Patient is feeling much better.  She has mild epigastric pain and bloating sensation which she states she has had multiple times before and this feels exactly like that.  No chest pain.  Her lab work is reassuring.  Troponin is negative x 2, CT scan does not show any dissection, does show severe stenosis at the origin of the celiac artery.  She has not had any symptoms consistent with intestinal angina, specifically no pain with eating and this episode today did not seem to be brought on by eating.  I paged vascular surgery to discuss the stenosis to see if should be appropriate for outpatient follow-up.   Discussed with Dr. Myra Gianotti with vascular surgery who states this finding was similar to 2016, he does not feel like she needs any sort of vascular surgery follow-up and does not feel like this is causing her presentation.  Her lactic acid is normal as well further reassuring against mesenteric ischemia.  Other lab work is unremarkable.  On reassessment again she is asymptomatic and feeling well.  She  wonders if some of her symptoms may have been due to missing her abdomen for several days to think could be contributing.  She reported feeling some bloating which has resolved, she is not clinically obstructed and is tolerating p.o.  Overall her workup is quite reassuring.  I think she can follow-up close with her PCP.  She is comfortable with this plan.  Discharged in stable condition with return precautions.   Patient's presentation is most  consistent with acute complicated illness / injury requiring diagnostic workup.           Final Clinical Impression(s) / ED Diagnoses Final diagnoses:  Atypical chest pain    Rx / DC Orders ED Discharge Orders          Ordered    LORazepam (ATIVAN) 0.5 MG tablet  2 times daily        03/24/23 1458              [name], MD 03/24/23 1539

## 2023-03-24 NOTE — ED Notes (Signed)
ED Provider at bedside. 

## 2023-05-09 DIAGNOSIS — H02051 Trichiasis without entropian right upper eyelid: Secondary | ICD-10-CM | POA: Diagnosis not present

## 2023-05-09 DIAGNOSIS — H401131 Primary open-angle glaucoma, bilateral, mild stage: Secondary | ICD-10-CM | POA: Diagnosis not present

## 2023-05-26 ENCOUNTER — Other Ambulatory Visit: Payer: Self-pay | Admitting: Family Medicine

## 2023-05-26 DIAGNOSIS — Z1231 Encounter for screening mammogram for malignant neoplasm of breast: Secondary | ICD-10-CM

## 2023-06-09 ENCOUNTER — Ambulatory Visit
Admission: RE | Admit: 2023-06-09 | Discharge: 2023-06-09 | Disposition: A | Discharge status: Home / Self Care | Payer: Medicare PPO | Source: Ambulatory Visit | Attending: Family Medicine | Admitting: Family Medicine

## 2023-06-09 DIAGNOSIS — Z1231 Encounter for screening mammogram for malignant neoplasm of breast: Secondary | ICD-10-CM

## 2023-09-12 DIAGNOSIS — E7800 Pure hypercholesterolemia, unspecified: Secondary | ICD-10-CM | POA: Diagnosis not present

## 2023-09-12 DIAGNOSIS — J301 Allergic rhinitis due to pollen: Secondary | ICD-10-CM | POA: Diagnosis not present

## 2023-09-12 DIAGNOSIS — I471 Supraventricular tachycardia, unspecified: Secondary | ICD-10-CM | POA: Diagnosis not present

## 2023-09-12 DIAGNOSIS — I10 Essential (primary) hypertension: Secondary | ICD-10-CM | POA: Diagnosis not present

## 2023-09-12 DIAGNOSIS — F325 Major depressive disorder, single episode, in full remission: Secondary | ICD-10-CM | POA: Diagnosis not present

## 2023-09-12 DIAGNOSIS — F419 Anxiety disorder, unspecified: Secondary | ICD-10-CM | POA: Diagnosis not present

## 2023-09-12 DIAGNOSIS — K219 Gastro-esophageal reflux disease without esophagitis: Secondary | ICD-10-CM | POA: Diagnosis not present

## 2023-09-26 DIAGNOSIS — H524 Presbyopia: Secondary | ICD-10-CM | POA: Diagnosis not present

## 2023-09-26 DIAGNOSIS — H353131 Nonexudative age-related macular degeneration, bilateral, early dry stage: Secondary | ICD-10-CM | POA: Diagnosis not present

## 2023-09-26 DIAGNOSIS — H401131 Primary open-angle glaucoma, bilateral, mild stage: Secondary | ICD-10-CM | POA: Diagnosis not present

## 2023-10-06 DIAGNOSIS — R509 Fever, unspecified: Secondary | ICD-10-CM | POA: Diagnosis not present

## 2023-10-06 DIAGNOSIS — R0981 Nasal congestion: Secondary | ICD-10-CM | POA: Diagnosis not present

## 2023-10-06 DIAGNOSIS — R051 Acute cough: Secondary | ICD-10-CM | POA: Diagnosis not present

## 2023-11-17 DIAGNOSIS — M9904 Segmental and somatic dysfunction of sacral region: Secondary | ICD-10-CM | POA: Diagnosis not present

## 2023-11-17 DIAGNOSIS — M9903 Segmental and somatic dysfunction of lumbar region: Secondary | ICD-10-CM | POA: Diagnosis not present

## 2023-11-17 DIAGNOSIS — M9905 Segmental and somatic dysfunction of pelvic region: Secondary | ICD-10-CM | POA: Diagnosis not present

## 2023-11-17 DIAGNOSIS — M51361 Other intervertebral disc degeneration, lumbar region with lower extremity pain only: Secondary | ICD-10-CM | POA: Diagnosis not present

## 2023-11-21 DIAGNOSIS — M51361 Other intervertebral disc degeneration, lumbar region with lower extremity pain only: Secondary | ICD-10-CM | POA: Diagnosis not present

## 2023-11-21 DIAGNOSIS — M9905 Segmental and somatic dysfunction of pelvic region: Secondary | ICD-10-CM | POA: Diagnosis not present

## 2023-11-21 DIAGNOSIS — M9903 Segmental and somatic dysfunction of lumbar region: Secondary | ICD-10-CM | POA: Diagnosis not present

## 2023-11-21 DIAGNOSIS — M9904 Segmental and somatic dysfunction of sacral region: Secondary | ICD-10-CM | POA: Diagnosis not present

## 2023-11-23 DIAGNOSIS — M51361 Other intervertebral disc degeneration, lumbar region with lower extremity pain only: Secondary | ICD-10-CM | POA: Diagnosis not present

## 2023-11-23 DIAGNOSIS — M9905 Segmental and somatic dysfunction of pelvic region: Secondary | ICD-10-CM | POA: Diagnosis not present

## 2023-11-23 DIAGNOSIS — M9904 Segmental and somatic dysfunction of sacral region: Secondary | ICD-10-CM | POA: Diagnosis not present

## 2023-11-23 DIAGNOSIS — M9903 Segmental and somatic dysfunction of lumbar region: Secondary | ICD-10-CM | POA: Diagnosis not present

## 2023-12-01 ENCOUNTER — Encounter: Payer: Self-pay | Admitting: Cardiology

## 2023-12-01 DIAGNOSIS — M9904 Segmental and somatic dysfunction of sacral region: Secondary | ICD-10-CM | POA: Diagnosis not present

## 2023-12-01 DIAGNOSIS — M51361 Other intervertebral disc degeneration, lumbar region with lower extremity pain only: Secondary | ICD-10-CM | POA: Diagnosis not present

## 2023-12-01 DIAGNOSIS — M9905 Segmental and somatic dysfunction of pelvic region: Secondary | ICD-10-CM | POA: Diagnosis not present

## 2023-12-01 DIAGNOSIS — M9903 Segmental and somatic dysfunction of lumbar region: Secondary | ICD-10-CM | POA: Diagnosis not present

## 2023-12-07 DIAGNOSIS — M9903 Segmental and somatic dysfunction of lumbar region: Secondary | ICD-10-CM | POA: Diagnosis not present

## 2023-12-07 DIAGNOSIS — M51361 Other intervertebral disc degeneration, lumbar region with lower extremity pain only: Secondary | ICD-10-CM | POA: Diagnosis not present

## 2023-12-07 DIAGNOSIS — M9904 Segmental and somatic dysfunction of sacral region: Secondary | ICD-10-CM | POA: Diagnosis not present

## 2023-12-07 DIAGNOSIS — M9905 Segmental and somatic dysfunction of pelvic region: Secondary | ICD-10-CM | POA: Diagnosis not present

## 2023-12-14 DIAGNOSIS — M51361 Other intervertebral disc degeneration, lumbar region with lower extremity pain only: Secondary | ICD-10-CM | POA: Diagnosis not present

## 2023-12-14 DIAGNOSIS — M25562 Pain in left knee: Secondary | ICD-10-CM | POA: Diagnosis not present

## 2023-12-14 DIAGNOSIS — S8392XA Sprain of unspecified site of left knee, initial encounter: Secondary | ICD-10-CM | POA: Diagnosis not present

## 2023-12-14 DIAGNOSIS — W19XXXA Unspecified fall, initial encounter: Secondary | ICD-10-CM | POA: Diagnosis not present

## 2023-12-14 DIAGNOSIS — M9903 Segmental and somatic dysfunction of lumbar region: Secondary | ICD-10-CM | POA: Diagnosis not present

## 2023-12-14 DIAGNOSIS — M9905 Segmental and somatic dysfunction of pelvic region: Secondary | ICD-10-CM | POA: Diagnosis not present

## 2023-12-14 DIAGNOSIS — M9904 Segmental and somatic dysfunction of sacral region: Secondary | ICD-10-CM | POA: Diagnosis not present

## 2023-12-27 DIAGNOSIS — M9903 Segmental and somatic dysfunction of lumbar region: Secondary | ICD-10-CM | POA: Diagnosis not present

## 2023-12-27 DIAGNOSIS — M51361 Other intervertebral disc degeneration, lumbar region with lower extremity pain only: Secondary | ICD-10-CM | POA: Diagnosis not present

## 2023-12-27 DIAGNOSIS — M9905 Segmental and somatic dysfunction of pelvic region: Secondary | ICD-10-CM | POA: Diagnosis not present

## 2023-12-27 DIAGNOSIS — M9904 Segmental and somatic dysfunction of sacral region: Secondary | ICD-10-CM | POA: Diagnosis not present

## 2023-12-31 NOTE — Progress Notes (Unsigned)
  Cardiology Office Note:   Date:  01/03/2024  ID:  [name], DOB 1932-08-19, MRN [id number] PCP: Claudene Pellet, MD  [facility] Providers Cardiologist:  Lynwood Schilling, MD {  History of Present Illness:   [name] is a 89 y.o. female who presents for evaluation of palpitations.   I saw her last in 2018.  I saw her for palpitations.  She had mild carotid stenosis.    She has had PSVT.   She has had ablation.   She present for routine follow-up.  She is well.  She still works in her garden.  She sits down to do that.  She walks with a cane because of vertigo.  She denies any new presyncope or syncope.  She has had no chest pressure, neck or arm discomfort.  She has had rare palpitations.  She denies any PND or orthopnea.  She has had no weight gain or edema.  She was in the emergency room in December 2024.  I reviewed these records.  She had chest discomfort that was felt to be nonanginal.  She did have a CT which demonstrated some mild coronary calcium.  She had some celiac stenosis.  She had aortic atherosclerosis.  There was no pulmonary embolism however.  She thinks all of this was related to anxiety because she was missing her benzodiazepine.  She has had none of the symptoms since then.  ROS: As stated in the HPI and negative for all other systems.  Studies Reviewed:    EKG:     Normal sinus rhythm, rate 69, axis within normal limits, intervals within normal limits, no acute ST-T wave changes.   Risk Assessment/Calculations:     NA      Physical Exam:   VS:  BP (!) 130/50   Pulse 68   Ht 5' 3.5 (1.613 m)   Wt 152 lb (68.9 kg)   SpO2 95%   BMI 26.50 kg/m    Wt Readings from Last 3 Encounters:  01/03/24 152 lb (68.9 kg)  03/24/23 150 lb (68 kg)  12/22/21 151 lb 6.4 oz (68.7 kg)     GEN: Well nourished, well developed in no acute distress NECK: No JVD; No carotid bruits CARDIAC: RRR, no murmurs, rubs, gallops RESPIRATORY:  Clear to  auscultation without rales, wheezing or rhonchi  ABDOMEN: Soft, non-tender, non-distended EXTREMITIES:  No edema; No deformity   ASSESSMENT AND PLAN:   Palpitations: The patient has no significant tachypalpitations.  She takes her Cardizem  30 in the morning, 30 in the afternoon and 60 in the evening and this seems to work well with her.  We will keep her on this regimen.  Carotid stenosis: The patient had carotid stenosis 10 years ago and I will repeat a Doppler.    Follow up with me in a couple of years.  Signed, Lynwood Schilling, MD

## 2024-01-02 DIAGNOSIS — M9903 Segmental and somatic dysfunction of lumbar region: Secondary | ICD-10-CM | POA: Diagnosis not present

## 2024-01-02 DIAGNOSIS — M9905 Segmental and somatic dysfunction of pelvic region: Secondary | ICD-10-CM | POA: Diagnosis not present

## 2024-01-02 DIAGNOSIS — M51361 Other intervertebral disc degeneration, lumbar region with lower extremity pain only: Secondary | ICD-10-CM | POA: Diagnosis not present

## 2024-01-02 DIAGNOSIS — M9904 Segmental and somatic dysfunction of sacral region: Secondary | ICD-10-CM | POA: Diagnosis not present

## 2024-01-03 ENCOUNTER — Encounter: Payer: Self-pay | Admitting: Cardiology

## 2024-01-03 ENCOUNTER — Ambulatory Visit: Attending: Cardiology | Admitting: Cardiology

## 2024-01-03 VITALS — BP 130/50 | HR 68 | Ht 63.5 in | Wt 152.0 lb

## 2024-01-03 DIAGNOSIS — R002 Palpitations: Secondary | ICD-10-CM

## 2024-01-03 DIAGNOSIS — I6529 Occlusion and stenosis of unspecified carotid artery: Secondary | ICD-10-CM | POA: Diagnosis not present

## 2024-01-03 NOTE — Patient Instructions (Signed)
 Medication Instructions:  Your physician recommends that you continue on your current medications as directed. Please refer to the Current Medication list given to you today.  *If you need a refill on your cardiac medications before your next appointment, please call your pharmacy*  Lab Work: NONE If you have labs (blood work) drawn today and your tests are completely normal, you will receive your results only by: MyChart Message (if you have MyChart) OR A paper copy in the mail If you have any lab test that is abnormal or we need to change your treatment, we will call you to review the results.  Testing/Procedures: Carotid Dopplers  Follow-Up: At [facility], you and your health needs are our priority.  As part of our continuing mission to provide you with exceptional heart care, our providers are all part of one team.  This team includes your primary Cardiologist (physician) and Advanced Practice Providers or APPs (Physician Assistants and Nurse Practitioners) who all work together to provide you with the care you need, when you need it.  Your next appointment:   2 year(s)  Provider:   [place], MD  We recommend signing up for the patient portal called MyChart.  Sign up information is provided on this After Visit Summary.  MyChart is used to connect with patients for Virtual Visits (Telemedicine).  Patients are able to view lab/test results, encounter notes, upcoming appointments, etc.  Non-urgent messages can be sent to your provider as well.   To learn more about what you can do with MyChart, go to [email or web address].

## 2024-01-11 ENCOUNTER — Ambulatory Visit (HOSPITAL_COMMUNITY)
Admission: RE | Admit: 2024-01-11 | Discharge: 2024-01-11 | Disposition: A | Discharge status: Home / Self Care | Source: Ambulatory Visit | Attending: Cardiology | Admitting: Cardiology

## 2024-01-11 DIAGNOSIS — I6523 Occlusion and stenosis of bilateral carotid arteries: Secondary | ICD-10-CM | POA: Insufficient documentation

## 2024-01-11 DIAGNOSIS — I6529 Occlusion and stenosis of unspecified carotid artery: Secondary | ICD-10-CM | POA: Insufficient documentation

## 2024-01-12 ENCOUNTER — Ambulatory Visit: Payer: Self-pay | Admitting: Cardiology

## 2024-01-25 ENCOUNTER — Emergency Department (HOSPITAL_COMMUNITY)

## 2024-01-25 ENCOUNTER — Encounter (HOSPITAL_COMMUNITY): Payer: Self-pay

## 2024-01-25 ENCOUNTER — Emergency Department (HOSPITAL_COMMUNITY)
Admission: EM | Admit: 2024-01-25 | Discharge: 2024-01-25 | Disposition: A | Discharge status: Home / Self Care | Attending: Emergency Medicine | Admitting: Emergency Medicine

## 2024-01-25 ENCOUNTER — Other Ambulatory Visit: Payer: Self-pay

## 2024-01-25 DIAGNOSIS — I700 Atherosclerosis of aorta: Secondary | ICD-10-CM | POA: Diagnosis not present

## 2024-01-25 DIAGNOSIS — R55 Syncope and collapse: Secondary | ICD-10-CM | POA: Insufficient documentation

## 2024-01-25 DIAGNOSIS — Z7982 Long term (current) use of aspirin: Secondary | ICD-10-CM | POA: Diagnosis not present

## 2024-01-25 DIAGNOSIS — R519 Headache, unspecified: Secondary | ICD-10-CM | POA: Insufficient documentation

## 2024-01-25 DIAGNOSIS — R42 Dizziness and giddiness: Secondary | ICD-10-CM | POA: Insufficient documentation

## 2024-01-25 DIAGNOSIS — I6523 Occlusion and stenosis of bilateral carotid arteries: Secondary | ICD-10-CM | POA: Diagnosis not present

## 2024-01-25 LAB — BASIC METABOLIC PANEL WITH GFR
Anion gap: 10 (ref 5–15)
BUN: 17 mg/dL (ref 8–23)
CO2: 29 mmol/L (ref 22–32)
Calcium: 9 mg/dL (ref 8.9–10.3)
Chloride: 104 mmol/L (ref 98–111)
Creatinine, Ser: 0.87 mg/dL (ref 0.44–1.00)
GFR, Estimated: >60 mL/min (ref >60)
Glucose, Bld: 112 mg/dL — ABNORMAL HIGH (ref 70–99)
Potassium: 3.9 mmol/L (ref 3.5–5.1)
Sodium: 143 mmol/L (ref 135–145)

## 2024-01-25 LAB — TROPONIN I (HIGH SENSITIVITY): Troponin I (High Sensitivity): 6 ng/L (ref <18)

## 2024-01-25 LAB — CBC WITH DIFFERENTIAL/PLATELET
Abs Immature Granulocytes: 0.02 K/uL (ref 0.00–0.07)
Basophils Absolute: 0 K/uL (ref 0.0–0.1)
Basophils Relative: 1 %
Eosinophils Absolute: 0.1 K/uL (ref 0.0–0.5)
Eosinophils Relative: 2 %
HCT: 42.2 % (ref 36.0–46.0)
Hemoglobin: 13.9 g/dL (ref 12.0–15.0)
Immature Granulocytes: 0 %
Lymphocytes Relative: 23 %
Lymphs Abs: 1.4 K/uL (ref 0.7–4.0)
MCH: 32.2 pg (ref 26.0–34.0)
MCHC: 32.9 g/dL (ref 30.0–36.0)
MCV: 97.7 fL (ref 80.0–100.0)
Monocytes Absolute: 0.6 K/uL (ref 0.1–1.0)
Monocytes Relative: 9 %
Neutro Abs: 4 K/uL (ref 1.7–7.7)
Neutrophils Relative %: 65 %
Platelets: 182 K/uL (ref 150–400)
RBC: 4.32 MIL/uL (ref 3.87–5.11)
RDW: 13.2 % (ref 11.5–15.5)
WBC: 6.1 K/uL (ref 4.0–10.5)
nRBC: 0 % (ref 0.0–0.2)

## 2024-01-25 MED ORDER — SODIUM CHLORIDE 0.9 % IV BOLUS
1000.0000 mL | Freq: Once | INTRAVENOUS | Status: AC
  Administered 2024-01-25: 1000 mL via INTRAVENOUS

## 2024-01-25 MED ORDER — ACETAMINOPHEN 325 MG PO TABS
650.0000 mg | ORAL_TABLET | Freq: Once | ORAL | Status: AC
  Administered 2024-01-25: 650 mg via ORAL
  Filled 2024-01-25: qty 2

## 2024-01-25 NOTE — Discharge Instructions (Signed)
 Stay well-hydrated.  Return if symptoms worsen.

## 2024-01-25 NOTE — ED Triage Notes (Signed)
 PT arrived by [facility] from home, PT was washing dishes and states she got dizzy and laid on the couch. No other symptoms or problems noted. PT BP was elevated per ems. PT states no HX of HTN. PT alert and talking and no distress noted.

## 2024-01-25 NOTE — ED Provider Notes (Signed)
 [place] EMERGENCY DEPARTMENT AT [facility] Provider Note   CSN: [id number] Arrival date & time: 01/25/24  2105     Patient presents with: Near Syncope   [name] is a 89 y.o. female.   Patient here after episode of lightheadedness and dizziness while doing the dishes.  She was standing doing the dishes got dizzy lightheaded folic she is in a pass out but she did not.  Symptoms have mostly resolved now.  She does have a history of vertigo but this felt different.  Some and a mild headache.  No chest pain no weakness no numbness no tingling no vision loss.  She does have a history of SVT history of high cholesterol.  She has had ablation in the past for SVT.  She was not tachycardic with EMS but possible this could have been SVT process.  But did not feel like it.  She has not had much to drink today.  She denies any nausea vomiting diarrhea.  She denies any shortness of breath.  No recent surgery or travel.  The history is provided by the patient.       Prior to Admission medications   Medication Sig Start Date End Date Taking? Authorizing Provider  aspirin 81 MG tablet Take 81 mg by mouth daily.      [provider]  Calcium Carbonate-Vitamin D (CALCIUM 600 + D PO) Take by mouth daily.    [provider]  Cholecalciferol 1000 units capsule Take 1,000 Units by mouth daily.     [provider]  diltiazem  (CARDIZEM ) 30 MG tablet Take 1 tablet (30 mg total) by mouth 4 (four) times daily. 12/28/21   Lavona Agent, MD  dorzolamide-timolol (COSOPT) 22.3-6.8 MG/ML ophthalmic solution Place 1 drop into both eyes 2 (two) times daily.    [provider]  escitalopram (LEXAPRO) 10 MG tablet Take 10 mg by mouth daily. 10/07/14   [provider]  fexofenadine (ALLEGRA) 180 MG tablet Take 180 mg by mouth daily as needed for allergies or rhinitis.    [provider]  LORazepam  (ATIVAN ) 0.5 MG tablet Take 1 tablet (0.5 mg  total) by mouth 2 (two) times daily. 03/24/23   Davis, Jonathon H, MD  Multiple Vitamin (MULTIVITAMIN) tablet Take 1 tablet by mouth daily.     [provider]  Omega-3 Fatty Acids (FISH OIL) 1000 MG CAPS Take 1 capsule by mouth daily.     [provider]  pantoprazole (PROTONIX) 40 MG tablet Take 40 mg by mouth daily.      [provider]  Polyethyl Glycol-Propyl Glycol (SYSTANE) 0.4-0.3 % GEL ophthalmic gel Place 1 application into both eyes 2 (two) times daily.    [provider]  rosuvastatin (CRESTOR) 5 MG tablet Take 5 mg by mouth daily.      [provider]    Allergies: Codeine, Erythromycin, Metronidazole, Pseudoephedrine, Sertraline hcl, and Venlafaxine    Review of Systems  Updated Vital Signs BP (!) 162/63 (BP Location: Left Arm)   Pulse 73   Temp (!) 97.4 F (36.3 C)   Resp 18   Ht 5' 3 (1.6 m)   Wt 68.5 kg   SpO2 100%   BMI 26.75 kg/m   Physical Exam Vitals and nursing note reviewed.  Constitutional:      General: She is not in acute distress.    Appearance: She is well-developed. She is not ill-appearing.  HENT:     Head: Normocephalic and  atraumatic.     Nose: Nose normal.     Mouth/Throat:     Mouth: Mucous membranes are moist.  Eyes:     Extraocular Movements: Extraocular movements intact.     Conjunctiva/sclera: Conjunctivae normal.     Pupils: Pupils are equal, round, and reactive to light.  Cardiovascular:     Rate and Rhythm: Normal rate and regular rhythm.     Pulses: Normal pulses.     Heart sounds: Normal heart sounds. No murmur heard. Pulmonary:     Effort: Pulmonary effort is normal. No respiratory distress.     Breath sounds: Normal breath sounds.  Abdominal:     General: Abdomen is flat.     Palpations: Abdomen is soft.     Tenderness: There is no abdominal tenderness.  Musculoskeletal:        General: No swelling.     Cervical back: Normal range of motion and neck supple.  Skin:     General: Skin is warm and dry.     Capillary Refill: Capillary refill takes less than 2 seconds.  Neurological:     General: No focal deficit present.     Mental Status: She is alert and oriented to person, place, and time.     Cranial Nerves: No cranial nerve deficit.     Sensory: No sensory deficit.     Motor: No weakness.     Coordination: Coordination normal.     Comments: 5+ out of 5 strength throughout, normal sensation, no drift, normal finger-to-nose finger, normal speech normal gait  Psychiatric:        Mood and Affect: Mood normal.     (all labs ordered are listed, but only abnormal results are displayed) Labs Reviewed  BASIC METABOLIC PANEL WITH GFR - Abnormal; Notable for the following components:      Result Value   Glucose, Bld 112 (*)    All other components within normal limits  CBC WITH DIFFERENTIAL/PLATELET  TROPONIN I (HIGH SENSITIVITY)    EKG: EKG Interpretation Date/Time:  Wednesday January 25 2024 22:32:16 EDT Ventricular Rate:  73 PR Interval:  148 QRS Duration:  90 QT Interval:  430 QTC Calculation: 473 R Axis:   66  Text Interpretation: Sinus rhythm with occasional Premature ventricular complexes Otherwise normal ECG When compared with ECG of 24-Mar-2023 08:49, PREVIOUS ECG IS PRESENT Confirmed by Ruthe Cornet [id number]) on 01/25/2024 10:37:10 PM  Radiology: CT Head Wo Contrast Result Date: 01/25/2024 EXAM: CT HEAD WITHOUT CONTRAST 01/25/2024 09:49:17 PM TECHNIQUE: CT of the head was performed without the administration of intravenous contrast. Automated exposure control, iterative reconstruction, and/or weight based adjustment of the mA/kV was utilized to reduce the radiation dose to as low as reasonably achievable. COMPARISON: CT head from 03/24/2023. CLINICAL HISTORY: Headache, increasing frequency or severity. Near syncope. FINDINGS: BRAIN AND VENTRICLES: No acute hemorrhage. No evidence of acute infarct. No hydrocephalus. No extra-axial collection.  No mass effect or midline shift. There is cerebral and cerebellar volume loss. Patchy and confluent areas of decreased attenuation are noted throughout the deep and periventricular white matter of the cerebral hemispheres bilaterally, suggestive of chronic microvascular ischemic changes. Atherosclerotic calcifications are present within the cavernous internal carotid arteries. ORBITS: Bilateral lens replacement. No acute abnormality. SINUSES: No acute abnormality. SOFT TISSUES AND SKULL: No acute soft tissue abnormality. No skull fracture. IMPRESSION: 1. No acute intracranial abnormality. Electronically signed by: Morgane Naveau MD 01/25/2024 09:56 PM EDT RP Workstation: [id number]   DG Chest 2 View Result Date:  01/25/2024 CLINICAL DATA:  Near syncope EXAM: CHEST - 2 VIEW COMPARISON:  03/15/2013 FINDINGS: The heart size and mediastinal contours are within normal limits. Both lungs are clear. Degenerative changes of the spine. Aortic atherosclerosis. IMPRESSION: No active cardiopulmonary disease. Electronically Signed   By: Luke Bun M.D.   On: 01/25/2024 21:45     Procedures   Medications Ordered in the ED  acetaminophen  (TYLENOL ) tablet 650 mg (650 mg Oral Given 01/25/24 2158)  sodium chloride 0.9 % bolus 1,000 mL (1,000 mLs Intravenous New Bag/Given 01/25/24 2208)                                    Medical Decision Making Amount and/or Complexity of Data Reviewed Labs: ordered. Radiology: ordered.  Risk OTC drugs.   Kaiyah Shira Bobst is here after near syncope episode.  Normal vitals.  No fever.  Neurologically intact on exam.  Ambulatory in the room.  Orthostatics are normal.  She was standing doing the dishes she got lightheaded dizzy fell like she is gena pass out but did not.  She has a history of SVT status post ablation.  History of vertigo.  Felt different than her vertigo.  She did not really appreciate any palpitations.  She is having a mild headache now.  She did not  drink or eat much today.  Overall she denies any nausea vomiting diarrhea.  No chest pain.  She has no PE risk factors.  Ultimately will check basic labs EKG head CT chest x-ray troponin give IV fluids and Tylenol  and reevaluate.  Differential diagnosis likely vasovagal type event versus less likely vertigo arrhythmia.  No concern for stroke.  Seems less likely to be ACS.  Anticipate if labs are unremarkable she can be discharged.  CT scan of the head and x-ray of the chest per radiology report are unremarkable.  EKG shows sinus rhythm.  No ischemic changes per my review and interpretation of EKG.  Lab work thus far shows no significant leukocytosis or anemia.  Awaiting BMP and troponin and if unremarkable anticipate discharge home.  I suspect vasovagal type process.  She is feeling better with IV fluids.  Additionally electrolytes are unremarkable.  Patient is feeling better.  Discharged in good condition.  Understands return precautions.  This chart was dictated using voice recognition software.  Despite best efforts to proofread,  errors can occur which can change the documentation meaning.      Final diagnoses:  Near syncope    ED Discharge Orders     None          Ruthe Cornet, DO 01/25/24 2313

## 2024-01-31 DIAGNOSIS — Z961 Presence of intraocular lens: Secondary | ICD-10-CM | POA: Diagnosis not present

## 2024-01-31 DIAGNOSIS — H401131 Primary open-angle glaucoma, bilateral, mild stage: Secondary | ICD-10-CM | POA: Diagnosis not present

## 2024-05-04 ENCOUNTER — Other Ambulatory Visit: Payer: Self-pay | Admitting: Family Medicine

## 2024-05-04 DIAGNOSIS — Z1231 Encounter for screening mammogram for malignant neoplasm of breast: Secondary | ICD-10-CM

## 2024-06-11 ENCOUNTER — Ambulatory Visit
